# Patient Record
Sex: Male | Born: 1980
Health system: Southern US, Community
[De-identification: ages and names within clinical notes are randomized; demographics above are authoritative.]

## PROBLEM LIST (undated history)

## (undated) DIAGNOSIS — Q666 Other congenital valgus deformities of feet: Secondary | ICD-10-CM

## (undated) DIAGNOSIS — Q66219 Congenital metatarsus primus varus, unspecified foot: Secondary | ICD-10-CM

## (undated) DIAGNOSIS — Z8669 Personal history of other diseases of the nervous system and sense organs: Secondary | ICD-10-CM

## (undated) HISTORY — PX: BUNIONECTOMY: SHX129

## (undated) HISTORY — DX: Personal history of other diseases of the nervous system and sense organs: Z86.69

---
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
## 2001-12-20 ENCOUNTER — Emergency Department (HOSPITAL_COMMUNITY): Admission: EM | Admit: 2001-12-20 | Discharge: 2001-12-20 | Payer: Self-pay | Admitting: Emergency Medicine

## 2001-12-20 ENCOUNTER — Encounter: Payer: Self-pay | Admitting: Emergency Medicine

## 2005-11-21 ENCOUNTER — Ambulatory Visit: Payer: Self-pay | Admitting: Internal Medicine

## 2005-11-23 ENCOUNTER — Ambulatory Visit: Payer: Self-pay | Admitting: Internal Medicine

## 2006-10-10 ENCOUNTER — Ambulatory Visit: Payer: Self-pay | Admitting: Internal Medicine

## 2006-10-10 LAB — CONVERTED CEMR LAB
ALT: 23 units/L (ref 0–53)
AST: 29 units/L (ref 0–37)
Albumin: 4.1 g/dL (ref 3.5–5.2)
Alkaline Phosphatase: 38 units/L — ABNORMAL LOW (ref 39–117)
BUN: 11 mg/dL (ref 6–23)
Basophils Absolute: 0 10*3/uL (ref 0.0–0.1)
Basophils Relative: 0.6 % (ref 0.0–1.0)
Bilirubin Urine: NEGATIVE
Bilirubin, Direct: 0.2 mg/dL (ref 0.0–0.3)
CO2: 34 meq/L — ABNORMAL HIGH (ref 19–32)
Calcium: 9.2 mg/dL (ref 8.4–10.5)
Chloride: 105 meq/L (ref 96–112)
Cholesterol: 117 mg/dL (ref 0–200)
Creatinine, Ser: 1 mg/dL (ref 0.4–1.5)
Eosinophils Absolute: 0.3 10*3/uL (ref 0.0–0.6)
Eosinophils Relative: 4.7 % (ref 0.0–5.0)
GFR calc Af Amer: 116 mL/min
GFR calc non Af Amer: 96 mL/min
Glucose, Bld: 87 mg/dL (ref 70–99)
HCT: 43.7 % (ref 39.0–52.0)
HDL: 42.5 mg/dL (ref >39.0)
Hemoglobin, Urine: NEGATIVE
Hemoglobin: 14.9 g/dL (ref 13.0–17.0)
Ketones, ur: NEGATIVE mg/dL
LDL Cholesterol: 66 mg/dL (ref 0–99)
Leukocytes, UA: NEGATIVE
Lymphocytes Relative: 28.8 % (ref 12.0–46.0)
MCHC: 34.1 g/dL (ref 30.0–36.0)
MCV: 79.6 fL (ref 78.0–100.0)
Monocytes Absolute: 0.6 10*3/uL (ref 0.2–0.7)
Monocytes Relative: 10.2 % (ref 3.0–11.0)
Neutro Abs: 2.9 10*3/uL (ref 1.4–7.7)
Neutrophils Relative %: 55.7 % (ref 43.0–77.0)
Nitrite: NEGATIVE
Platelets: 308 10*3/uL (ref 150–400)
Potassium: 3.5 meq/L (ref 3.5–5.1)
RBC: 5.48 M/uL (ref 4.22–5.81)
RDW: 12.3 % (ref 11.5–14.6)
Sodium: 144 meq/L (ref 135–145)
Specific Gravity, Urine: >=1.03 (ref 1.000–1.03)
TSH: 0.55 microintl units/mL (ref 0.35–5.50)
Total Bilirubin: 1.2 mg/dL (ref 0.3–1.2)
Total CHOL/HDL Ratio: 2.8
Total Protein: 7 g/dL (ref 6.0–8.3)
Triglycerides: 44 mg/dL (ref 0–149)
Urine Glucose: NEGATIVE mg/dL
Urobilinogen, UA: 1 (ref 0.0–1.0)
VLDL: 9 mg/dL (ref 0–40)
WBC: 5.4 10*3/uL (ref 4.5–10.5)
pH: 6 (ref 5.0–8.0)

## 2006-10-12 ENCOUNTER — Ambulatory Visit: Payer: Self-pay | Admitting: Internal Medicine

## 2007-01-25 ENCOUNTER — Encounter: Payer: Self-pay | Admitting: Internal Medicine

## 2007-09-10 ENCOUNTER — Ambulatory Visit: Payer: Self-pay | Admitting: Internal Medicine

## 2007-09-10 DIAGNOSIS — R040 Epistaxis: Secondary | ICD-10-CM | POA: Insufficient documentation

## 2011-04-05 ENCOUNTER — Ambulatory Visit (INDEPENDENT_AMBULATORY_CARE_PROVIDER_SITE_OTHER): Payer: BC Managed Care – PPO | Admitting: Family

## 2011-04-05 ENCOUNTER — Encounter: Payer: Self-pay | Admitting: Family

## 2011-04-05 ENCOUNTER — Ambulatory Visit: Payer: Self-pay | Admitting: Family

## 2011-04-05 DIAGNOSIS — J029 Acute pharyngitis, unspecified: Secondary | ICD-10-CM

## 2011-04-05 MED ORDER — AMOXICILLIN-POT CLAVULANATE 875-125 MG PO TABS: 1.0000 | ORAL_TABLET | Freq: Two times a day (BID) | ORAL | Status: AC

## 2011-04-05 NOTE — Progress Notes (Signed)
Subjective:    Patient ID: Daniel Mcclure, male    DOB: [birth year], 31 y.o.   MRN: [id number]  HPI  Daniel Mcclure is a 31 yr old who presents today to establish care.  He presents today with complaint of sore throat.  Sore throat started yesterday.  Daughter and wife have strep.  Denies fever.  + associated HA.  Symptoms are worsening.   He notes that he hurt his knee playing basketball 2 days ago.  He applied ice that night and notes that the knee pain is improving.   Reports that he is healthy overall.     Review of Systems  Constitutional: Negative for unexpected weight change.  HENT: Positive for congestion.   Eyes: Negative for visual disturbance.  Respiratory: Positive for cough.   Cardiovascular: Negative for leg swelling.  Gastrointestinal: Negative for nausea, vomiting and diarrhea.  Genitourinary: Negative for dysuria and hematuria.  Musculoskeletal:       Mild left knee pain since he played basketball the other day.  Skin: Negative for rash.  Neurological: Positive for headaches.  Hematological: Positive for adenopathy.  Psychiatric/Behavioral:       Denies depression/anxiety   History reviewed. No pertinent past medical history.  History   Social History  . Marital Status: Married    Spouse Name: N/A    Number of Children: 1  . Years of Education: N/A   Occupational History  .     Social History Main Topics  . Smoking status: Never Smoker   . Smokeless tobacco: Never Used  . Alcohol Use: Yes     occasional  . Drug Use: Not on file  . Sexually Active: Not on file   Other Topics Concern  . Not on file   Social History Narrative   Caffeine use:  1 soda every other dayRegular exercise:  4-5 x weeklyMarried, 1 daughter born in 29Works at [employer], television/film set. Student at [employer]    Past Surgical History  Procedure Date  . Knee surgery 1992    left knee surgery due to ?sports injury?    Family History  Problem Relation Age  of Onset  . Hypertension Father   . Stroke Paternal Grandfather   . Cancer Neg Hx     No Known Allergies  No current outpatient prescriptions on file prior to visit.    BP 120/68  Pulse 74  Temp(Src) 98.6 F (37 C) (Oral)  Resp 16  Ht 5' 10.5" (1.791 m)  Wt 164 lb (74.39 kg)  BMI 23.20 kg/m2  SpO2 98%       Objective:   Physical Exam  Constitutional: He is oriented to person, place, and time. He appears well-developed and well-nourished. No distress.  HENT:  Head: Normocephalic and atraumatic.  Right Ear: Tympanic membrane and ear canal normal.  Left Ear: Tympanic membrane and ear canal normal.  Mouth/Throat: Posterior oropharyngeal erythema present. No oropharyngeal exudate or posterior oropharyngeal edema.  Cardiovascular: Normal rate and regular rhythm.   No murmur heard. Pulmonary/Chest: Effort normal and breath sounds normal. No respiratory distress. He has no wheezes. He has no rales. He exhibits no tenderness.  Lymphadenopathy:    He has no cervical adenopathy.  Neurological: He is alert and oriented to person, place, and time.  Skin: Skin is warm and dry. No rash noted. No erythema.  Psychiatric: He has a normal mood and affect. His behavior is normal. Judgment and thought content normal.  Assessment & Plan:

## 2011-04-05 NOTE — Patient Instructions (Signed)
Please call if symptoms worsen or if you are not feeling better in 2-3 days.  

## 2011-04-08 DIAGNOSIS — J029 Acute pharyngitis, unspecified: Secondary | ICD-10-CM | POA: Insufficient documentation

## 2011-04-08 NOTE — Assessment & Plan Note (Signed)
Rapid strep is negative, but wife and daughter have tested positive. Will plan to treat empirically with Augmentin.

## 2012-08-02 ENCOUNTER — Emergency Department (HOSPITAL_BASED_OUTPATIENT_CLINIC_OR_DEPARTMENT_OTHER)
Admission: EM | Admit: 2012-08-02 | Discharge: 2012-08-02 | Disposition: A | Discharge status: Home / Self Care | Payer: BC Managed Care – PPO | Attending: Emergency Medicine | Admitting: Emergency Medicine

## 2012-08-02 ENCOUNTER — Encounter (HOSPITAL_BASED_OUTPATIENT_CLINIC_OR_DEPARTMENT_OTHER): Payer: Self-pay | Admitting: *Deleted

## 2012-08-02 DIAGNOSIS — H00016 Hordeolum externum left eye, unspecified eyelid: Secondary | ICD-10-CM

## 2012-08-02 DIAGNOSIS — H00019 Hordeolum externum unspecified eye, unspecified eyelid: Secondary | ICD-10-CM | POA: Insufficient documentation

## 2012-08-02 MED ORDER — ERYTHROMYCIN 5 MG/GM OP OINT: TOPICAL_OINTMENT | OPHTHALMIC | Status: DC

## 2012-08-02 NOTE — ED Provider Notes (Signed)
History     CSN: [id number]  Arrival date & time 08/02/12  0849   First MD Initiated Contact with Patient 08/02/12 [phone]      Chief Complaint  Patient presents with  . Stye    Patient is a 32 y.o. male presenting with eye pain. The history is provided by the patient.  Eye Pain This is a new problem. The current episode started more than 2 days ago. The problem occurs daily. The problem has been gradually worsening. Pertinent negatives include no headaches. Nothing aggravates the symptoms. Nothing relieves the symptoms. He has tried nothing for the symptoms.    PMH - none  Past Surgical History  Procedure Laterality Date  . Knee surgery  1992    left knee surgery due to ?sports injury?    Family History  Problem Relation Age of Onset  . Hypertension Father   . Stroke Paternal Grandfather   . Cancer Neg Hx     History  Substance Use Topics  . Smoking status: Never Smoker   . Smokeless tobacco: Never Used  . Alcohol Use: Yes     Comment: occasional      Review of Systems  Constitutional: Negative for fever.  Eyes: Positive for pain.  Neurological: Negative for headaches.    Allergies  Review of patient's allergies indicates no known allergies.  Home Medications   Current Outpatient Rx  Name  Route  Sig  Dispense  Refill  . erythromycin ophthalmic ointment      Place a 1/2 inch ribbon of ointment into the lower eyelid BID for one week   1 g   0     BP 120/62  Pulse 62  Temp(Src) 98.4 F (36.9 C) (Oral)  SpO2 100%  Physical Exam CONSTITUTIONAL: Well developed/well nourished HEAD: Normocephalic/atraumatic EYES: EOMI/PERRL Hordeolum on left lower eyedlid margin No foreign body to OS.  No signs of trauma.  No conjunctival erythema ENMT: Mucous membranes moist NECK: supple no meningeal signs CV: S1/S2 noted, no murmurs/rubs/gallops noted LUNGS: Lungs are clear to auscultation bilaterally, no apparent distress ABDOMEN: soft NEURO: Pt is  awake/alert, moves all extremitiesx4 EXTREMITIES: full ROM SKIN: warm, color normal PSYCH: no abnormalities of mood noted  ED Course  Procedures   1. Hordeolum externum of left eye       MDM  Nursing notes including past medical history and social history reviewed and considered in documentation   Pt does not wear contacts Denies eye trauma Stable for d/c        Joya Gaskins, MD 08/02/12 [phone]

## 2012-08-02 NOTE — ED Notes (Signed)
Left eye painful swollen area lower rim of eye pt states has had stye before feels like same. No change in vision. No matting in mornings.

## 2014-04-04 ENCOUNTER — Ambulatory Visit (INDEPENDENT_AMBULATORY_CARE_PROVIDER_SITE_OTHER): Payer: BLUE CROSS/BLUE SHIELD | Admitting: Physician Assistant

## 2014-04-04 ENCOUNTER — Encounter: Payer: Self-pay | Admitting: Physician Assistant

## 2014-04-04 VITALS — BP 123/64 | HR 70 | Temp 100.2°F | Resp 16 | Ht 70.5 in | Wt 175.1 lb

## 2014-04-04 DIAGNOSIS — B9689 Other specified bacterial agents as the cause of diseases classified elsewhere: Secondary | ICD-10-CM | POA: Insufficient documentation

## 2014-04-04 DIAGNOSIS — R509 Fever, unspecified: Secondary | ICD-10-CM

## 2014-04-04 DIAGNOSIS — J019 Acute sinusitis, unspecified: Secondary | ICD-10-CM

## 2014-04-04 LAB — POCT INFLUENZA A/B
Influenza A, POC: NEGATIVE
Influenza B, POC: NEGATIVE

## 2014-04-04 MED ORDER — HYDROCOD POLST-CHLORPHEN POLST 10-8 MG/5ML PO LQCR: 5.0000 mL | Freq: Two times a day (BID) | ORAL | Status: DC | PRN

## 2014-04-04 MED ORDER — AMOXICILLIN-POT CLAVULANATE 875-125 MG PO TABS: 1.0000 | ORAL_TABLET | Freq: Two times a day (BID) | ORAL | Status: DC

## 2014-04-04 MED ORDER — FLUTICASONE PROPIONATE 50 MCG/ACT NA SUSP: 2.0000 | Freq: Every day | NASAL | Status: DC

## 2014-04-04 NOTE — Progress Notes (Signed)
Pre visit review using our clinic review tool, if applicable. No additional management support is needed unless otherwise documented below in the visit note/SLS  

## 2014-04-04 NOTE — Progress Notes (Signed)
    Patient presents to clinic today c/o 4 days of progressively worsening PND, headaches, productive cough and fatigue.  Is now having ear pressure and pain.  Endorses has now developed a fever and some body aches, chills. Has taken Benadryl without relief of symptoms. Has not had flu shot this year.  No past medical history on file.  No current outpatient prescriptions on file prior to visit.   No current facility-administered medications on file prior to visit.    No Known Allergies  Family History  Problem Relation Age of Onset  . Hypertension Father   . Stroke Paternal Grandfather   . Cancer Neg Hx     History   Social History  . Marital Status: Married    Spouse Name: N/A  . Number of Children: 1  . Years of Education: N/A   Occupational History  .     Social History Main Topics  . Smoking status: Never Smoker   . Smokeless tobacco: Never Used  . Alcohol Use: Yes     Comment: occasional  . Drug Use: Not on file  . Sexual Activity: Not on file   Other Topics Concern  . None   Social History Narrative   Caffeine use:  1 soda every other day   Regular exercise:  4-5 x weekly   Married, 1 daughter born in 2010   Works at [employer] and [place] [employer] [place].    Student at [employer] - See HPI.  All other ROS are negative.  BP 123/64 mmHg  Pulse 70  Temp(Src) 100.2 F (37.9 C) (Oral)  Resp 16  Ht 5' 10.5" (1.791 m)  Wt 175 lb 2 oz (79.436 kg)  BMI 24.76 kg/m2  SpO2 100%  Physical Exam  Constitutional: He is oriented to person, place, and time and well-developed, well-nourished, and in no distress.  HENT:  Head: Normocephalic and atraumatic.  Right Ear: Tympanic membrane, external ear and ear canal normal.  Left Ear: Tympanic membrane, external ear and ear canal normal.  Nose: Mucosal edema and rhinorrhea present. Right sinus exhibits maxillary sinus tenderness. Left sinus exhibits maxillary sinus tenderness.    Mouth/Throat: Uvula is midline, oropharynx is clear and moist and mucous membranes are normal. No oropharyngeal exudate.  Eyes: Conjunctivae are normal. Pupils are equal, round, and reactive to light.  Neck: Neck supple.  Cardiovascular: Normal rate, regular rhythm, normal heart sounds and intact distal pulses.   Pulmonary/Chest: Effort normal and breath sounds normal. No respiratory distress. He has no wheezes. He has no rales. He exhibits no tenderness.  Lymphadenopathy:    He has no cervical adenopathy.  Neurological: He is alert and oriented to person, place, and time.  Skin: Skin is warm and dry. No rash noted.  Psychiatric: Affect normal.  Vitals reviewed.  Assessment/Plan: Acute bacterial sinusitis Flu swab negative. Rx Augmentin.  Increase fluids.  Rest.  Saline nasal spray.  Probiotic.  Mucinex as directed.  Humidifier in bedroom. Flonase daily.  Tussionex per orders..  Call or return to clinic if symptoms are not improving.

## 2014-04-04 NOTE — Addendum Note (Signed)
Addended by: Rockwell Germany on: 04/04/2014 05:15 PM   Modules accepted: Orders

## 2014-04-04 NOTE — Progress Notes (Signed)
Duplicate encounter.  Disregard.

## 2014-04-04 NOTE — Patient Instructions (Signed)
Please take antibiotic as directed.  Increase fluid intake.  Use Saline nasal spray.  Take a daily multivitamin. Use Flonase daily as directed for sinus inflammation and allergy symptoms.  Use Tussionex as directed for cough.  Place a humidifier in the bedroom.  Please call or return clinic if symptoms are not improving.  Sinusitis Sinusitis is redness, soreness, and swelling (inflammation) of the paranasal sinuses. Paranasal sinuses are air pockets within the bones of your face (beneath the eyes, the middle of the forehead, or above the eyes). In healthy paranasal sinuses, mucus is able to drain out, and air is able to circulate through them by way of your nose. However, when your paranasal sinuses are inflamed, mucus and air can become trapped. This can allow bacteria and other germs to grow and cause infection. Sinusitis can develop quickly and last only a short time (acute) or continue over a long period (chronic). Sinusitis that lasts for more than 12 weeks is considered chronic.  CAUSES  Causes of sinusitis include:  Allergies.  Structural abnormalities, such as displacement of the cartilage that separates your nostrils (deviated septum), which can decrease the air flow through your nose and sinuses and affect sinus drainage.  Functional abnormalities, such as when the small hairs (cilia) that line your sinuses and help remove mucus do not work properly or are not present. SYMPTOMS  Symptoms of acute and chronic sinusitis are the same. The primary symptoms are pain and pressure around the affected sinuses. Other symptoms include:  Upper toothache.  Earache.  Headache.  Bad breath.  Decreased sense of smell and taste.  A cough, which worsens when you are lying flat.  Fatigue.  Fever.  Thick drainage from your nose, which often is green and may contain pus (purulent).  Swelling and warmth over the affected sinuses. DIAGNOSIS  Your caregiver will perform a physical exam. During  the exam, your caregiver may:  Look in your nose for signs of abnormal growths in your nostrils (nasal polyps).  Tap over the affected sinus to check for signs of infection.  View the inside of your sinuses (endoscopy) with a special imaging device with a light attached (endoscope), which is inserted into your sinuses. If your caregiver suspects that you have chronic sinusitis, one or more of the following tests may be recommended:  Allergy tests.  Nasal culture A sample of mucus is taken from your nose and sent to a lab and screened for bacteria.  Nasal cytology A sample of mucus is taken from your nose and examined by your caregiver to determine if your sinusitis is related to an allergy. TREATMENT  Most cases of acute sinusitis are related to a viral infection and will resolve on their own within 10 days. Sometimes medicines are prescribed to help relieve symptoms (pain medicine, decongestants, nasal steroid sprays, or saline sprays).  However, for sinusitis related to a bacterial infection, your caregiver will prescribe antibiotic medicines. These are medicines that will help kill the bacteria causing the infection.  Rarely, sinusitis is caused by a fungal infection. In theses cases, your caregiver will prescribe antifungal medicine. For some cases of chronic sinusitis, surgery is needed. Generally, these are cases in which sinusitis recurs more than 3 times per year, despite other treatments. HOME CARE INSTRUCTIONS   Drink plenty of water. Water helps thin the mucus so your sinuses can drain more easily.  Use a humidifier.  Inhale steam 3 to 4 times a day (for example, sit in the bathroom with  the shower running).  Apply a warm, moist washcloth to your face 3 to 4 times a day, or as directed by your caregiver.  Use saline nasal sprays to help moisten and clean your sinuses.  Take over-the-counter or prescription medicines for pain, discomfort, or fever only as directed by your  caregiver. SEEK IMMEDIATE MEDICAL CARE IF:  You have increasing pain or severe headaches.  You have nausea, vomiting, or drowsiness.  You have swelling around your face.  You have vision problems.  You have a stiff neck.  You have difficulty breathing. MAKE SURE YOU:   Understand these instructions.  Will watch your condition.  Will get help right away if you are not doing well or get worse. Document Released: 02/07/2005 Document Revised: 05/02/2011 Document Reviewed: 02/22/2011 [facility] Patient Information 2014 [place], [place].

## 2014-04-04 NOTE — Assessment & Plan Note (Signed)
Flu swab negative. Rx Augmentin.  Increase fluids.  Rest.  Saline nasal spray.  Probiotic.  Mucinex as directed.  Humidifier in bedroom. Flonase daily.  Tussionex per orders..  Call or return to clinic if symptoms are not improving.

## 2014-10-01 ENCOUNTER — Other Ambulatory Visit: Payer: Self-pay | Admitting: Orthopedic Surgery

## 2014-10-13 ENCOUNTER — Encounter: Payer: Self-pay | Admitting: *Deleted

## 2014-10-13 ENCOUNTER — Telehealth: Payer: Self-pay | Admitting: *Deleted

## 2014-10-13 NOTE — Telephone Encounter (Signed)
Pre-Visit Call completed with patient and chart updated.   Pre-Visit Info documented in Specialty Comments under [employer].    

## 2014-10-13 NOTE — Addendum Note (Signed)
Addended by: Leticia Penna A on: 10/13/2014 11:45 AM   Modules accepted: Orders, Medications

## 2014-10-13 NOTE — Telephone Encounter (Signed)
Unable to reach patient at time of Pre-Visit Call.  Left message for patient to return call when available.    

## 2014-10-14 ENCOUNTER — Encounter: Payer: Self-pay | Admitting: Physician Assistant

## 2014-10-14 ENCOUNTER — Ambulatory Visit (INDEPENDENT_AMBULATORY_CARE_PROVIDER_SITE_OTHER): Payer: BLUE CROSS/BLUE SHIELD | Admitting: Physician Assistant

## 2014-10-14 VITALS — BP 108/80 | HR 59 | Temp 98.5°F | Ht 70.0 in | Wt 177.0 lb

## 2014-10-14 DIAGNOSIS — Z0000 Encounter for general adult medical examination without abnormal findings: Secondary | ICD-10-CM

## 2014-10-14 LAB — URINALYSIS, ROUTINE W REFLEX MICROSCOPIC
Bilirubin Urine: NEGATIVE
HGB URINE DIPSTICK: NEGATIVE
Ketones, ur: NEGATIVE
LEUKOCYTES UA: NEGATIVE
NITRITE: NEGATIVE
RBC / HPF: NONE SEEN (ref 0–?)
Specific Gravity, Urine: >=1.03 — AB (ref 1.000–1.030)
Total Protein, Urine: NEGATIVE
Urine Glucose: NEGATIVE
Urobilinogen, UA: 0.2 (ref 0.0–1.0)
WBC, UA: NONE SEEN (ref 0–?)
pH: 6 (ref 5.0–8.0)

## 2014-10-14 NOTE — Assessment & Plan Note (Signed)
Depression screen negative. Health Maintenance reviewed -- Immunizations up-to-date. HIV screen in 2010. Preventive schedule discussed and handout given in AVS. Will obtain fasting labs today.

## 2014-10-14 NOTE — Progress Notes (Signed)
Patient presents to clinic today for annual exam.  Patient is fasting for labs.  Endorses well-balanced diet. Endorses daily exercise. Body mass index is 25.4 kg/(m^2).  Health Maintenance: Dental -- up-to-date Vision -- up-to-date Immunizations -- up-to-date HIV Screen -- average risk. Has had one-time screening 2010 before daughter was born.  Past Medical History  Diagnosis Date  . Bunion     Past Surgical History  Procedure Laterality Date  . Knee surgery  1992    left knee surgery due to ?sports injury?  Marland Kitchen Foot surgery Left     No current outpatient prescriptions on file prior to visit.   No current facility-administered medications on file prior to visit.    No Known Allergies  Family History  Problem Relation Age of Onset  . Hypertension Father   . Stroke Paternal Grandfather   . Cancer Neg Hx     Social History   Social History  . Marital Status: Married    Spouse Name: N/A  . Number of Children: 1  . Years of Education: N/A   Occupational History  .     Social History Main Topics  . Smoking status: Never Smoker   . Smokeless tobacco: Never Used  . Alcohol Use: Yes     Comment: occasional  . Drug Use: Not on file  . Sexual Activity: Not on file   Other Topics Concern  . Not on file   Social History Narrative   Caffeine use:  1 soda every other day   Regular exercise:  4-5 x weekly   Married, 1 daughter born in 2010   Works at [employer] and [place].    Student at [employer]            Review of Systems  Constitutional: Negative for fever and weight loss.  HENT: Negative for ear discharge, ear pain, hearing loss and tinnitus.   Eyes: Negative for blurred vision, double vision, photophobia and pain.  Respiratory: Negative for cough and shortness of breath.   Cardiovascular: Negative for chest pain and palpitations.  Gastrointestinal: Negative for heartburn, nausea, vomiting, abdominal pain, diarrhea, constipation,  blood in stool and melena.  Genitourinary: Negative for dysuria, urgency, frequency, hematuria and flank pain.  Musculoskeletal: Negative for falls.  Neurological: Negative for dizziness, loss of consciousness and headaches.  Endo/Heme/Allergies: Negative for environmental allergies.  Psychiatric/Behavioral: Negative for depression, suicidal ideas, hallucinations and substance abuse. The patient is not nervous/anxious and does not have insomnia.     BP 108/80 mmHg  Pulse 59  Temp(Src) 98.5 F (36.9 C) (Oral)  Ht 5' 10"  (1.778 m)  Wt 177 lb (80.287 kg)  BMI 25.40 kg/m2  SpO2 96%  Physical Exam  Constitutional: He is oriented to person, place, and time and well-developed, well-nourished, and in no distress.  HENT:  Head: Normocephalic and atraumatic.  Right Ear: External ear normal.  Left Ear: External ear normal.  Nose: Nose normal.  Mouth/Throat: Oropharynx is clear and moist. No oropharyngeal exudate.  Eyes: Conjunctivae and EOM are normal. Pupils are equal, round, and reactive to light.  Neck: Neck supple. No thyromegaly present.  Cardiovascular: Normal rate, regular rhythm, normal heart sounds and intact distal pulses.   Pulmonary/Chest: Effort normal and breath sounds normal. No respiratory distress. He has no wheezes. He has no rales. He exhibits no tenderness.  Abdominal: Soft. Bowel sounds are normal. He exhibits no distension and no mass. There is no tenderness. There is no rebound and  no guarding.  Genitourinary: Testes/scrotum normal.  Defers.  Lymphadenopathy:    He has no cervical adenopathy.  Neurological: He is alert and oriented to person, place, and time.  Skin: Skin is warm and dry. No rash noted.  Psychiatric: Affect normal.  Vitals reviewed.   No results found for this or any previous visit (from the past 2160 hour(s)).  Assessment/Plan: Visit for preventive health examination Depression screen negative. Health Maintenance reviewed -- Immunizations  up-to-date. HIV screen in 2010. Preventive schedule discussed and handout given in AVS. Will obtain fasting labs today.

## 2014-10-14 NOTE — Progress Notes (Signed)
Pre visit review using our clinic review tool, if applicable. No additional management support is needed unless otherwise documented below in the visit note. 

## 2014-10-14 NOTE — Patient Instructions (Signed)
Please go to the lab for blood work.  I will call you with your results. If your blood work is normal we will follow-up yearly for physicals.  If anything is abnormal we will treat you accordingly and get you in for a follow-up.  Preventive Care for Adults A healthy lifestyle and preventive care can promote health and wellness. Preventive health guidelines for men include the following key practices:  A routine yearly physical is a good way to check with your health care provider about your health and preventative screening. It is a chance to share any concerns and updates on your health and to receive a thorough exam.  Visit your dentist for a routine exam and preventative care every 6 months. Brush your teeth twice a day and floss once a day. Good oral hygiene prevents tooth decay and gum disease.  The frequency of eye exams is based on your age, health, family medical history, use of contact lenses, and other factors. Follow your health care provider's recommendations for frequency of eye exams.  Eat a healthy diet. Foods such as vegetables, fruits, whole grains, low-fat dairy products, and lean protein foods contain the nutrients you need without too many calories. Decrease your intake of foods high in solid fats, added sugars, and salt. Eat the right amount of calories for you.Get information about a proper diet from your health care provider, if necessary.  Regular physical exercise is one of the most important things you can do for your health. Most adults should get at least 150 minutes of moderate-intensity exercise (any activity that increases your heart rate and causes you to sweat) each week. In addition, most adults need muscle-strengthening exercises on 2 or more days a week.  Maintain a healthy weight. The body mass index (BMI) is a screening tool to identify possible weight problems. It provides an estimate of body fat based on height and weight. Your health care provider can find  your BMI and can help you achieve or maintain a healthy weight.For adults 20 years and older:  A BMI below 18.5 is considered underweight.  A BMI of 18.5 to 24.9 is normal.  A BMI of 25 to 29.9 is considered overweight.  A BMI of 30 and above is considered obese.  Maintain normal blood lipids and cholesterol levels by exercising and minimizing your intake of saturated fat. Eat a balanced diet with plenty of fruit and vegetables. Blood tests for lipids and cholesterol should begin at age 67 and be repeated every 5 years. If your lipid or cholesterol levels are high, you are over 50, or you are at high risk for heart disease, you may need your cholesterol levels checked more frequently.Ongoing high lipid and cholesterol levels should be treated with medicines if diet and exercise are not working.  If you smoke, find out from your health care provider how to quit. If you do not use tobacco, do not start.  Lung cancer screening is recommended for adults aged 27-80 years who are at high risk for developing lung cancer because of a history of smoking. A yearly low-dose CT scan of the lungs is recommended for people who have at least a 30-pack-year history of smoking and are a current smoker or have quit within the past 15 years. A pack year of smoking is smoking an average of 1 pack of cigarettes a day for 1 year (for example: 1 pack a day for 30 years or 2 packs a day for 15 years). Yearly screening  should continue until the smoker has stopped smoking for at least 15 years. Yearly screening should be stopped for people who develop a health problem that would prevent them from having lung cancer treatment.  If you choose to drink alcohol, do not have more than 2 drinks per day. One drink is considered to be 12 ounces (355 mL) of beer, 5 ounces (148 mL) of wine, or 1.5 ounces (44 mL) of liquor.  Avoid use of street drugs. Do not share needles with anyone. Ask for help if you need support or instructions  about stopping the use of drugs.  High blood pressure causes heart disease and increases the risk of stroke. Your blood pressure should be checked at least every 1-2 years. Ongoing high blood pressure should be treated with medicines, if weight loss and exercise are not effective.  If you are 16-108 years old, ask your health care provider if you should take aspirin to prevent heart disease.  Diabetes screening involves taking a blood sample to check your fasting blood sugar level. This should be done once every 3 years, after age 4, if you are within normal weight and without risk factors for diabetes. Testing should be considered at a younger age or be carried out more frequently if you are overweight and have at least 1 risk factor for diabetes.  Colorectal cancer can be detected and often prevented. Most routine colorectal cancer screening begins at the age of 12 and continues through age 28. However, your health care provider may recommend screening at an earlier age if you have risk factors for colon cancer. On a yearly basis, your health care provider may provide home test kits to check for hidden blood in the stool. Use of a small camera at the end of a tube to directly examine the colon (sigmoidoscopy or colonoscopy) can detect the earliest forms of colorectal cancer. Talk to your health care provider about this at age 51, when routine screening begins. Direct exam of the colon should be repeated every 5-10 years through age 21, unless early forms of precancerous polyps or small growths are found.  People who are at an increased risk for hepatitis B should be screened for this virus. You are considered at high risk for hepatitis B if:  You were born in a country where hepatitis B occurs often. Talk with your health care provider about which countries are considered high risk.  Your parents were born in a high-risk country and you have not received a shot to protect against hepatitis B  (hepatitis B vaccine).  You have HIV or AIDS.  You use needles to inject street drugs.  You live with, or have sex with, someone who has hepatitis B.  You are a man who has sex with other men (MSM).  You get hemodialysis treatment.  You take certain medicines for conditions such as cancer, organ transplantation, and autoimmune conditions.  Hepatitis C blood testing is recommended for all people born from [birth year] through 1965 and any individual with known risks for hepatitis C.  Practice safe sex. Use condoms and avoid high-risk sexual practices to reduce the spread of sexually transmitted infections (STIs). STIs include gonorrhea, chlamydia, syphilis, trichomonas, herpes, HPV, and human immunodeficiency virus (HIV). Herpes, HIV, and HPV are viral illnesses that have no cure. They can result in disability, cancer, and death.  If you are at risk of being infected with HIV, it is recommended that you take a prescription medicine daily to prevent HIV infection.  This is called preexposure prophylaxis (PrEP). You are considered at risk if:  You are a man who has sex with other men (MSM) and have other risk factors.  You are a heterosexual man, are sexually active, and are at increased risk for HIV infection.  You take drugs by injection.  You are sexually active with a partner who has HIV.  Talk with your health care provider about whether you are at high risk of being infected with HIV. If you choose to begin PrEP, you should first be tested for HIV. You should then be tested every 3 months for as long as you are taking PrEP.  A one-time screening for abdominal aortic aneurysm (AAA) and surgical repair of large AAAs by ultrasound are recommended for men ages 36 to 43 years who are current or former smokers.  Healthy men should no longer receive prostate-specific antigen (PSA) blood tests as part of routine cancer screening. Talk with your health care provider about prostate cancer  screening.  Testicular cancer screening is not recommended for adult males who have no symptoms. Screening includes self-exam, a health care provider exam, and other screening tests. Consult with your health care provider about any symptoms you have or any concerns you have about testicular cancer.  Use sunscreen. Apply sunscreen liberally and repeatedly throughout the day. You should seek shade when your shadow is shorter than you. Protect yourself by wearing long sleeves, pants, a wide-brimmed hat, and sunglasses year round, whenever you are outdoors.  Once a month, do a whole-body skin exam, using a mirror to look at the skin on your back. Tell your health care provider about new moles, moles that have irregular borders, moles that are larger than a pencil eraser, or moles that have changed in shape or color.  Stay current with required vaccines (immunizations).  Influenza vaccine. All adults should be immunized every year.  Tetanus, diphtheria, and acellular pertussis (Td, Tdap) vaccine. An adult who has not previously received Tdap or who does not know his vaccine status should receive 1 dose of Tdap. This initial dose should be followed by tetanus and diphtheria toxoids (Td) booster doses every 10 years. Adults with an unknown or incomplete history of completing a 3-dose immunization series with Td-containing vaccines should begin or complete a primary immunization series including a Tdap dose. Adults should receive a Td booster every 10 years.  Varicella vaccine. An adult without evidence of immunity to varicella should receive 2 doses or a second dose if he has previously received 1 dose.  Human papillomavirus (HPV) vaccine. Males aged 24-21 years who have not received the vaccine previously should receive the 3-dose series. Males aged 22-26 years may be immunized. Immunization is recommended through the age of 27 years for any male who has sex with males and did not get any or all doses  earlier. Immunization is recommended for any person with an immunocompromised condition through the age of 70 years if he did not get any or all doses earlier. During the 3-dose series, the second dose should be obtained 4-8 weeks after the first dose. The third dose should be obtained 24 weeks after the first dose and 16 weeks after the second dose.  Zoster vaccine. One dose is recommended for adults aged 78 years or older unless certain conditions are present.  Measles, mumps, and rubella (MMR) vaccine. Adults born before [birth year] generally are considered immune to measles and mumps. Adults born in [birth year] or later should have 1 or more  doses of MMR vaccine unless there is a contraindication to the vaccine or there is laboratory evidence of immunity to each of the three diseases. A routine second dose of MMR vaccine should be obtained at least 28 days after the first dose for students attending postsecondary schools, health care workers, or international travelers. People who received inactivated measles vaccine or an unknown type of measles vaccine during 1963-1967 should receive 2 doses of MMR vaccine. People who received inactivated mumps vaccine or an unknown type of mumps vaccine before 1979 and are at high risk for mumps infection should consider immunization with 2 doses of MMR vaccine. Unvaccinated health care workers born before [birth year] who lack laboratory evidence of measles, mumps, or rubella immunity or laboratory confirmation of disease should consider measles and mumps immunization with 2 doses of MMR vaccine or rubella immunization with 1 dose of MMR vaccine.  Pneumococcal 13-valent conjugate (PCV13) vaccine. When indicated, a person who is uncertain of his immunization history and has no record of immunization should receive the PCV13 vaccine. An adult aged 64 years or older who has certain medical conditions and has not been previously immunized should receive 1 dose of PCV13 vaccine. This PCV13  should be followed with a dose of pneumococcal polysaccharide (PPSV23) vaccine. The PPSV23 vaccine dose should be obtained at least 8 weeks after the dose of PCV13 vaccine. An adult aged 38 years or older who has certain medical conditions and previously received 1 or more doses of PPSV23 vaccine should receive 1 dose of PCV13. The PCV13 vaccine dose should be obtained 1 or more years after the last PPSV23 vaccine dose.  Pneumococcal polysaccharide (PPSV23) vaccine. When PCV13 is also indicated, PCV13 should be obtained first. All adults aged 10 years and older should be immunized. An adult younger than age 16 years who has certain medical conditions should be immunized. Any person who resides in a nursing home or long-term care facility should be immunized. An adult smoker should be immunized. People with an immunocompromised condition and certain other conditions should receive both PCV13 and PPSV23 vaccines. People with human immunodeficiency virus (HIV) infection should be immunized as soon as possible after diagnosis. Immunization during chemotherapy or radiation therapy should be avoided. Routine use of PPSV23 vaccine is not recommended for American Indians, [place] Natives, or people younger than 65 years unless there are medical conditions that require PPSV23 vaccine. When indicated, people who have unknown immunization and have no record of immunization should receive PPSV23 vaccine. One-time revaccination 5 years after the first dose of PPSV23 is recommended for people aged 19-64 years who have chronic kidney failure, nephrotic syndrome, asplenia, or immunocompromised conditions. People who received 1-2 doses of PPSV23 before age 64 years should receive another dose of PPSV23 vaccine at age 36 years or later if at least 5 years have passed since the previous dose. Doses of PPSV23 are not needed for people immunized with PPSV23 at or after age 24 years.  Meningococcal vaccine. Adults with asplenia or  persistent complement component deficiencies should receive 2 doses of quadrivalent meningococcal conjugate (MenACWY-D) vaccine. The doses should be obtained at least 2 months apart. Microbiologists working with certain meningococcal bacteria, [employer] recruits, people at risk during an outbreak, and people who travel to or live in countries with a high rate of meningitis should be immunized. A first-year college student up through age 110 years who is living in a residence hall should receive a dose if he did not receive a dose on or  after his 16th birthday. Adults who have certain high-risk conditions should receive one or more doses of vaccine.  Hepatitis A vaccine. Adults who wish to be protected from this disease, have certain high-risk conditions, work with hepatitis A-infected animals, work in hepatitis A research labs, or travel to or work in countries with a high rate of hepatitis A should be immunized. Adults who were previously unvaccinated and who anticipate close contact with an international adoptee during the first 60 days after arrival in the [place] from a country with a high rate of hepatitis A should be immunized.  Hepatitis B vaccine. Adults should be immunized if they wish to be protected from this disease, have certain high-risk conditions, may be exposed to blood or other infectious body fluids, are household contacts or sex partners of hepatitis B positive people, are clients or workers in certain care facilities, or travel to or work in countries with a high rate of hepatitis B.  Haemophilus influenzae type b (Hib) vaccine. A previously unvaccinated person with asplenia or sickle cell disease or having a scheduled splenectomy should receive 1 dose of Hib vaccine. Regardless of previous immunization, a recipient of a hematopoietic stem cell transplant should receive a 3-dose series 6-12 months after his successful transplant. Hib vaccine is not recommended for adults with HIV  infection. Preventive Service / Frequency Ages 52 to 60  Blood pressure check.** / Every 1 to 2 years.  Lipid and cholesterol check.** / Every 5 years beginning at age 45.  Hepatitis C blood test.** / For any individual with known risks for hepatitis C.  Skin self-exam. / Monthly.  Influenza vaccine. / Every year.  Tetanus, diphtheria, and acellular pertussis (Tdap, Td) vaccine.** / Consult your health care provider. 1 dose of Td every 10 years.  Varicella vaccine.** / Consult your health care provider.  HPV vaccine. / 3 doses over 6 months, if 91 or younger.  Measles, mumps, rubella (MMR) vaccine.** / You need at least 1 dose of MMR if you were born in 1957 or later. You may also need a second dose.  Pneumococcal 13-valent conjugate (PCV13) vaccine.** / Consult your health care provider.  Pneumococcal polysaccharide (PPSV23) vaccine.** / 1 to 2 doses if you smoke cigarettes or if you have certain conditions.  Meningococcal vaccine.** / 1 dose if you are age 57 to 32 years and a [employer] researcher living in a residence hall, or have one of several medical conditions. You may also need additional booster doses.  Hepatitis A vaccine.** / Consult your health care provider.  Hepatitis B vaccine.** / Consult your health care provider.  Haemophilus influenzae type b (Hib) vaccine.** / Consult your health care provider. Ages 22 to 57  Blood pressure check.** / Every 1 to 2 years.  Lipid and cholesterol check.** / Every 5 years beginning at age 87.  Lung cancer screening. / Every year if you are aged 81-80 years and have a 30-pack-year history of smoking and currently smoke or have quit within the past 15 years. Yearly screening is stopped once you have quit smoking for at least 15 years or develop a health problem that would prevent you from having lung cancer treatment.  Fecal occult blood test (FOBT) of stool. / Every year beginning at age 76 and continuing until age 58.  You may not have to do this test if you get a colonoscopy every 10 years.  Flexible sigmoidoscopy** or colonoscopy.** / Every 5 years for a flexible sigmoidoscopy or every  10 years for a colonoscopy beginning at age 19 and continuing until age 47.  Hepatitis C blood test.** / For all people born from [birth year] through 1965 and any individual with known risks for hepatitis C.  Skin self-exam. / Monthly.  Influenza vaccine. / Every year.  Tetanus, diphtheria, and acellular pertussis (Tdap/Td) vaccine.** / Consult your health care provider. 1 dose of Td every 10 years.  Varicella vaccine.** / Consult your health care provider.  Zoster vaccine.** / 1 dose for adults aged 11 years or older.  Measles, mumps, rubella (MMR) vaccine.** / You need at least 1 dose of MMR if you were born in 1957 or later. You may also need a second dose.  Pneumococcal 13-valent conjugate (PCV13) vaccine.** / Consult your health care provider.  Pneumococcal polysaccharide (PPSV23) vaccine.** / 1 to 2 doses if you smoke cigarettes or if you have certain conditions.  Meningococcal vaccine.** / Consult your health care provider.  Hepatitis A vaccine.** / Consult your health care provider.  Hepatitis B vaccine.** / Consult your health care provider.  Haemophilus influenzae type b (Hib) vaccine.** / Consult your health care provider. Ages 70 and over  Blood pressure check.** / Every 1 to 2 years.  Lipid and cholesterol check.**/ Every 5 years beginning at age 41.  Lung cancer screening. / Every year if you are aged 90-80 years and have a 30-pack-year history of smoking and currently smoke or have quit within the past 15 years. Yearly screening is stopped once you have quit smoking for at least 15 years or develop a health problem that would prevent you from having lung cancer treatment.  Fecal occult blood test (FOBT) of stool. / Every year beginning at age 75 and continuing until age 39. You may not have to do this  test if you get a colonoscopy every 10 years.  Flexible sigmoidoscopy** or colonoscopy.** / Every 5 years for a flexible sigmoidoscopy or every 10 years for a colonoscopy beginning at age 48 and continuing until age 18.  Hepatitis C blood test.** / For all people born from [birth year] through 1965 and any individual with known risks for hepatitis C.  Abdominal aortic aneurysm (AAA) screening.** / A one-time screening for ages 64 to 26 years who are current or former smokers.  Skin self-exam. / Monthly.  Influenza vaccine. / Every year.  Tetanus, diphtheria, and acellular pertussis (Tdap/Td) vaccine.** / 1 dose of Td every 10 years.  Varicella vaccine.** / Consult your health care provider.  Zoster vaccine.** / 1 dose for adults aged 46 years or older.  Pneumococcal 13-valent conjugate (PCV13) vaccine.** / Consult your health care provider.  Pneumococcal polysaccharide (PPSV23) vaccine.** / 1 dose for all adults aged 15 years and older.  Meningococcal vaccine.** / Consult your health care provider.  Hepatitis A vaccine.** / Consult your health care provider.  Hepatitis B vaccine.** / Consult your health care provider.  Haemophilus influenzae type b (Hib) vaccine.** / Consult your health care provider. **Family history and personal history of risk and conditions may change your health care provider's recommendations. Document Released: 04/05/2001 Document Revised: 02/12/2013 Document Reviewed: 07/05/2010 [facility] Patient Information 2015 [place], [place]. This information is not intended to replace advice given to you by your health care provider. Make sure you discuss any questions you have with your health care provider.

## 2014-12-22 ENCOUNTER — Ambulatory Visit: Payer: BLUE CROSS/BLUE SHIELD | Admitting: Physician Assistant

## 2014-12-22 ENCOUNTER — Telehealth: Payer: Self-pay | Admitting: Physician Assistant

## 2014-12-23 DIAGNOSIS — Q66219 Congenital metatarsus primus varus, unspecified foot: Secondary | ICD-10-CM

## 2014-12-23 HISTORY — DX: Other congenital valgus deformities of feet: Q66.219

## 2014-12-26 ENCOUNTER — Encounter (HOSPITAL_BASED_OUTPATIENT_CLINIC_OR_DEPARTMENT_OTHER): Payer: Self-pay | Admitting: *Deleted

## 2014-12-26 NOTE — Telephone Encounter (Signed)
Pt was no show 12/22/14 3:00pm for follow up appt, pt lvm at 2:05 stating he will not be in to his appt, pt has not rescheduled, charge or no charge?

## 2014-12-26 NOTE — Telephone Encounter (Signed)
No charge this time.

## 2015-01-01 ENCOUNTER — Ambulatory Visit (HOSPITAL_BASED_OUTPATIENT_CLINIC_OR_DEPARTMENT_OTHER): Payer: BLUE CROSS/BLUE SHIELD | Admitting: Certified Registered"

## 2015-01-01 ENCOUNTER — Encounter (HOSPITAL_BASED_OUTPATIENT_CLINIC_OR_DEPARTMENT_OTHER): Payer: Self-pay | Admitting: Certified Registered"

## 2015-01-01 ENCOUNTER — Ambulatory Visit (HOSPITAL_BASED_OUTPATIENT_CLINIC_OR_DEPARTMENT_OTHER)
Admission: RE | Admit: 2015-01-01 | Discharge: 2015-01-01 | Disposition: A | Discharge status: Home / Self Care | Payer: BLUE CROSS/BLUE SHIELD | Source: Ambulatory Visit | Attending: Orthopedic Surgery | Admitting: Orthopedic Surgery

## 2015-01-01 ENCOUNTER — Encounter (HOSPITAL_BASED_OUTPATIENT_CLINIC_OR_DEPARTMENT_OTHER)
Admission: RE | Disposition: A | Discharge status: Home / Self Care | Payer: Self-pay | Source: Ambulatory Visit | Attending: Orthopedic Surgery

## 2015-01-01 DIAGNOSIS — M2011 Hallux valgus (acquired), right foot: Secondary | ICD-10-CM | POA: Diagnosis not present

## 2015-01-01 DIAGNOSIS — Q6621 Congenital metatarsus primus varus: Secondary | ICD-10-CM | POA: Diagnosis not present

## 2015-01-01 HISTORY — DX: Other congenital valgus deformities of feet: Q66.6

## 2015-01-01 HISTORY — DX: Congenital metatarsus primus varus, unspecified foot: Q66.219

## 2015-01-01 HISTORY — PX: METATARSAL OSTEOTOMY WITH BUNIONECTOMY: SHX5662

## 2015-01-01 SURGERY — BUNIONECTOMY, WITH METATARSAL OSTEOTOMY
Anesthesia: Regional | Site: Foot | Laterality: Right

## 2015-01-01 MED ORDER — OXYCODONE HCL 5 MG PO TABS: 5.0000 mg | ORAL_TABLET | ORAL | Status: DC | PRN

## 2015-01-01 MED ORDER — LACTATED RINGERS IV SOLN
INTRAVENOUS | Status: DC
  Administered 2015-01-01 (×2): via INTRAVENOUS

## 2015-01-01 MED ORDER — SODIUM CHLORIDE 0.9 % IV SOLN: INTRAVENOUS | Status: DC

## 2015-01-01 MED ORDER — CEFAZOLIN SODIUM-DEXTROSE 2-3 GM-% IV SOLR
2.0000 g | INTRAVENOUS | Status: AC
  Administered 2015-01-01: 2 g via INTRAVENOUS

## 2015-01-01 MED ORDER — FENTANYL CITRATE (PF) 100 MCG/2ML IJ SOLN
INTRAMUSCULAR | Status: AC
  Filled 2015-01-01: qty 4

## 2015-01-01 MED ORDER — LIDOCAINE HCL (CARDIAC) 20 MG/ML IV SOLN
INTRAVENOUS | Status: AC
  Filled 2015-01-01: qty 5

## 2015-01-01 MED ORDER — BUPIVACAINE-EPINEPHRINE (PF) 0.5% -1:200000 IJ SOLN
INTRAMUSCULAR | Status: AC
  Filled 2015-01-01: qty 90

## 2015-01-01 MED ORDER — MEPERIDINE HCL 25 MG/ML IJ SOLN: 6.2500 mg | INTRAMUSCULAR | Status: DC | PRN

## 2015-01-01 MED ORDER — GLYCOPYRROLATE 0.2 MG/ML IJ SOLN: 0.2000 mg | Freq: Once | INTRAMUSCULAR | Status: DC | PRN

## 2015-01-01 MED ORDER — FENTANYL CITRATE (PF) 100 MCG/2ML IJ SOLN
INTRAMUSCULAR | Status: AC
  Filled 2015-01-01: qty 2

## 2015-01-01 MED ORDER — ONDANSETRON HCL 4 MG/2ML IJ SOLN
INTRAMUSCULAR | Status: AC
  Filled 2015-01-01: qty 2

## 2015-01-01 MED ORDER — CHLORHEXIDINE GLUCONATE 4 % EX LIQD: 60.0000 mL | Freq: Once | CUTANEOUS | Status: DC

## 2015-01-01 MED ORDER — HYDROMORPHONE HCL 1 MG/ML IJ SOLN: 0.2500 mg | INTRAMUSCULAR | Status: DC | PRN

## 2015-01-01 MED ORDER — 0.9 % SODIUM CHLORIDE (POUR BTL) OPTIME
TOPICAL | Status: DC | PRN
  Administered 2015-01-01: 200 mL

## 2015-01-01 MED ORDER — DEXAMETHASONE SODIUM PHOSPHATE 10 MG/ML IJ SOLN
INTRAMUSCULAR | Status: DC | PRN
  Administered 2015-01-01: 10 mg via INTRAVENOUS

## 2015-01-01 MED ORDER — ONDANSETRON HCL 4 MG/2ML IJ SOLN
INTRAMUSCULAR | Status: DC | PRN
  Administered 2015-01-01: 4 mg via INTRAVENOUS

## 2015-01-01 MED ORDER — LIDOCAINE HCL (CARDIAC) 20 MG/ML IV SOLN
INTRAVENOUS | Status: DC | PRN
  Administered 2015-01-01: 30 mg via INTRAVENOUS

## 2015-01-01 MED ORDER — PROPOFOL 10 MG/ML IV BOLUS
INTRAVENOUS | Status: DC | PRN
Start: 2015-01-01 — End: 2015-01-01
  Administered 2015-01-01: 200 mg via INTRAVENOUS

## 2015-01-01 MED ORDER — BUPIVACAINE-EPINEPHRINE (PF) 0.5% -1:200000 IJ SOLN
INTRAMUSCULAR | Status: DC | PRN
  Administered 2015-01-01: 20 mL via PERINEURAL

## 2015-01-01 MED ORDER — SCOPOLAMINE 1 MG/3DAYS TD PT72: 1.0000 | MEDICATED_PATCH | Freq: Once | TRANSDERMAL | Status: DC | PRN

## 2015-01-01 MED ORDER — MIDAZOLAM HCL 2 MG/2ML IJ SOLN
INTRAMUSCULAR | Status: AC
  Filled 2015-01-01: qty 2

## 2015-01-01 MED ORDER — MIDAZOLAM HCL 2 MG/2ML IJ SOLN
INTRAMUSCULAR | Status: AC
  Filled 2015-01-01: qty 4

## 2015-01-01 MED ORDER — OXYCODONE HCL 5 MG/5ML PO SOLN: 5.0000 mg | Freq: Once | ORAL | Status: DC | PRN

## 2015-01-01 MED ORDER — PROPOFOL 500 MG/50ML IV EMUL
INTRAVENOUS | Status: AC
  Filled 2015-01-01: qty 50

## 2015-01-01 MED ORDER — FENTANYL CITRATE (PF) 100 MCG/2ML IJ SOLN
50.0000 ug | INTRAMUSCULAR | Status: DC | PRN
  Administered 2015-01-01: 100 ug via INTRAVENOUS
  Administered 2015-01-01: 50 ug via INTRAVENOUS

## 2015-01-01 MED ORDER — DEXAMETHASONE SODIUM PHOSPHATE 10 MG/ML IJ SOLN
INTRAMUSCULAR | Status: AC
  Filled 2015-01-01: qty 1

## 2015-01-01 MED ORDER — OXYCODONE HCL 5 MG PO TABS: 5.0000 mg | ORAL_TABLET | Freq: Once | ORAL | Status: DC | PRN

## 2015-01-01 MED ORDER — BUPIVACAINE-EPINEPHRINE 0.5% -1:200000 IJ SOLN
INTRAMUSCULAR | Status: DC | PRN
  Administered 2015-01-01: 10 mL

## 2015-01-01 MED ORDER — CEFAZOLIN SODIUM-DEXTROSE 2-3 GM-% IV SOLR
INTRAVENOUS | Status: AC
  Filled 2015-01-01: qty 50

## 2015-01-01 MED ORDER — BUPIVACAINE HCL (PF) 0.5 % IJ SOLN
INTRAMUSCULAR | Status: AC
  Filled 2015-01-01: qty 90

## 2015-01-01 MED ORDER — MIDAZOLAM HCL 2 MG/2ML IJ SOLN
1.0000 mg | INTRAMUSCULAR | Status: DC | PRN
  Administered 2015-01-01: 2 mg via INTRAVENOUS
  Administered 2015-01-01: 1 mg via INTRAVENOUS

## 2015-01-01 SURGICAL SUPPLY — 70 items
BANDAGE ESMARK 6X9 LF (GAUZE/BANDAGES/DRESSINGS) ×1 IMPLANT
BIT DRILL 2X3.5 HAND QK RELEAS (BIT) IMPLANT
BIT DRILL 2X3.5 QUICK RELEASE (BIT) ×2
BLADE AVERAGE 25X9 (BLADE) ×2 IMPLANT
BLADE LONG MED 25X9 (BLADE) IMPLANT
BLADE MICRO SAGITTAL (BLADE) IMPLANT
BLADE SURG 15 STRL LF DISP TIS (BLADE) ×3 IMPLANT
BLADE SURG 15 STRL SS (BLADE) ×4
BNDG CMPR 9X6 STRL LF SNTH (GAUZE/BANDAGES/DRESSINGS) ×1
BNDG COHESIVE 4X5 TAN STRL (GAUZE/BANDAGES/DRESSINGS) ×2 IMPLANT
BNDG COHESIVE 6X5 TAN STRL LF (GAUZE/BANDAGES/DRESSINGS) IMPLANT
BNDG CONFORM 3 STRL LF (GAUZE/BANDAGES/DRESSINGS) ×2 IMPLANT
BNDG ESMARK 6X9 LF (GAUZE/BANDAGES/DRESSINGS) ×2
CHLORAPREP W/TINT 26ML (MISCELLANEOUS) ×2 IMPLANT
COUNTERSINK MULTI SCREW (BIT) ×2
COVER BACK TABLE 60X90IN (DRAPES) ×2 IMPLANT
CUFF TOURNIQUET SINGLE 34IN LL (TOURNIQUET CUFF) ×1 IMPLANT
DRAPE EXTREMITY T 121X128X90 (DRAPE) ×2 IMPLANT
DRAPE OEC MINIVIEW 54X84 (DRAPES) ×2 IMPLANT
DRAPE U-SHAPE 47X51 STRL (DRAPES) ×2 IMPLANT
DRSG MEPITEL 4X7.2 (GAUZE/BANDAGES/DRESSINGS) ×2 IMPLANT
ELECT REM PT RETURN 9FT ADLT (ELECTROSURGICAL) ×2
ELECTRODE REM PT RTRN 9FT ADLT (ELECTROSURGICAL) ×1 IMPLANT
GAUZE SPONGE 4X4 12PLY STRL (GAUZE/BANDAGES/DRESSINGS) ×2 IMPLANT
GLOVE BIO SURGEON STRL SZ8 (GLOVE) ×2 IMPLANT
GLOVE BIOGEL PI IND STRL 7.0 (GLOVE) IMPLANT
GLOVE BIOGEL PI IND STRL 8 (GLOVE) ×2 IMPLANT
GLOVE BIOGEL PI INDICATOR 7.0 (GLOVE) ×1
GLOVE BIOGEL PI INDICATOR 8 (GLOVE) ×1
GLOVE ECLIPSE 6.5 STRL STRAW (GLOVE) ×1 IMPLANT
GLOVE ECLIPSE 7.5 STRL STRAW (GLOVE) ×1 IMPLANT
GLOVE EXAM NITRILE MD LF STRL (GLOVE) IMPLANT
GOWN STRL REUS W/ TWL LRG LVL3 (GOWN DISPOSABLE) ×1 IMPLANT
GOWN STRL REUS W/ TWL XL LVL3 (GOWN DISPOSABLE) ×2 IMPLANT
GOWN STRL REUS W/TWL LRG LVL3 (GOWN DISPOSABLE) ×2
GOWN STRL REUS W/TWL XL LVL3 (GOWN DISPOSABLE) ×4
GUIDEWIRE .08 (WIRE) IMPLANT
K-WIRE .054X4 (WIRE) ×1 IMPLANT
NDL HYPO 25X1 1.5 SAFETY (NEEDLE) IMPLANT
NEEDLE HYPO 25X1 1.5 SAFETY (NEEDLE) ×2 IMPLANT
NS IRRIG 1000ML POUR BTL (IV SOLUTION) ×2 IMPLANT
PACK BASIN DAY SURGERY FS (CUSTOM PROCEDURE TRAY) ×2 IMPLANT
PAD CAST 4YDX4 CTTN HI CHSV (CAST SUPPLIES) ×1 IMPLANT
PADDING CAST ABS 4INX4YD NS (CAST SUPPLIES)
PADDING CAST ABS COTTON 4X4 ST (CAST SUPPLIES) IMPLANT
PADDING CAST COTTON 4X4 STRL (CAST SUPPLIES) ×2
PENCIL BUTTON HOLSTER BLD 10FT (ELECTRODE) ×2 IMPLANT
SANITIZER HAND PURELL 535ML FO (MISCELLANEOUS) ×2 IMPLANT
SCREW BONE LAG 2.3X16MM HEXA (Screw) IMPLANT
SCREW BONE NL 2.3X20MM HEXA (Screw) IMPLANT
SCREW COUNTERSINK MULTI SCREW (BIT) IMPLANT
SCREW LAG 2.3X16 (Screw) ×2 IMPLANT
SCREW NONLOCK 2.3X20MM (Screw) ×2 IMPLANT
SHEET MEDIUM DRAPE 40X70 STRL (DRAPES) ×2 IMPLANT
SLEEVE SCD COMPRESS KNEE MED (MISCELLANEOUS) ×2 IMPLANT
SPONGE LAP 18X18 X RAY DECT (DISPOSABLE) ×2 IMPLANT
STOCKINETTE 6  STRL (DRAPES) ×1
STOCKINETTE 6 STRL (DRAPES) ×1 IMPLANT
SUCTION FRAZIER TIP 10 FR DISP (SUCTIONS) ×1 IMPLANT
SUT ETHIBOND 3-0 V-5 (SUTURE) IMPLANT
SUT ETHILON 3 0 PS 1 (SUTURE) ×2 IMPLANT
SUT MNCRL AB 3-0 PS2 18 (SUTURE) ×2 IMPLANT
SUT VIC AB 0 SH 27 (SUTURE) IMPLANT
SUT VIC AB 2-0 SH 27 (SUTURE) ×2
SUT VIC AB 2-0 SH 27XBRD (SUTURE) IMPLANT
SYR BULB 3OZ (MISCELLANEOUS) ×2 IMPLANT
SYR CONTROL 10ML LL (SYRINGE) ×1 IMPLANT
TOWEL OR 17X24 6PK STRL BLUE (TOWEL DISPOSABLE) ×3 IMPLANT
TUBE CONNECTING 20X1/4 (TUBING) ×1 IMPLANT
UNDERPAD 30X30 (UNDERPADS AND DIAPERS) ×2 IMPLANT

## 2015-01-01 NOTE — Anesthesia Procedure Notes (Addendum)
Anesthesia Regional Block:  Adductor canal block  Pre-Anesthetic Checklist: ,, timeout performed, Correct Patient, Correct Site, Correct Laterality, Correct Procedure, Correct Position, site marked, Risks and benefits discussed,  Surgical consent,  Pre-op evaluation,  At surgeon's request and post-op pain management  Laterality: Right and Lower  Prep: chloraprep       Needles:  Injection technique: Single-shot  Needle Type: Echogenic Needle     Needle Length: 9cm 9 cm Needle Gauge: 21 and 21 G    Additional Needles:  Procedures: ultrasound guided (picture in chart) Adductor canal block Narrative:  Start time: 01/01/2015 7:06 AM End time: 01/01/2015 7:10 AM Injection made incrementally with aspirations every 5 mL.  Performed by: Personally  Anesthesiologist: CREWS, DAVID   Procedure Name: LMA Insertion Date/Time: 01/01/2015 7:32 AM Performed by: Evia Goldsmith D Pre-anesthesia Checklist: Patient identified, Emergency Drugs available, Suction available and Patient being monitored Patient Re-evaluated:Patient Re-evaluated prior to inductionOxygen Delivery Method: Circle System Utilized Preoxygenation: Pre-oxygenation with 100% oxygen Intubation Type: IV induction Ventilation: Mask ventilation without difficulty LMA: LMA inserted LMA Size: 4.0 Number of attempts: 1 Airway Equipment and Method: Bite block Placement Confirmation: positive ETCO2 Tube secured with: Tape Dental Injury: Teeth and Oropharynx as per pre-operative assessment       Right saphenous block image.

## 2015-01-01 NOTE — Op Note (Signed)
NAME:  Daniel Mcclure, Daniel Mcclure NO.:  [id number]  MEDICAL RECORD NO.:  [id number]  LOCATION:                                 FACILITY:  PHYSICIAN:  Wylene Simmer, MD             DATE OF BIRTH:  DATE OF PROCEDURE:  01/01/2015 DATE OF DISCHARGE:                              OPERATIVE REPORT   PREOPERATIVE DIAGNOSIS:  Right foot bunion with hallux valgus and metatarsus primus varus.  POSTOPERATIVE DIAGNOSIS:  Right foot bunion with hallux valgus and metatarsus primus varus.  PROCEDURES: 1. Right first metatarsal Scarf osteotomy. 2. Right modified McBride bunionectomy through a separate incision. 3. Right foot AP and lateral radiographs.  SURGEON:  Wylene Simmer, M.D.  ANESTHESIA:  General, regional.  ESTIMATED BLOOD LOSS:  Minimal.  TOURNIQUET TIME:  60 minutes at 250 mmHg.  COMPLICATIONS:  None apparent.  DISPOSITION:  Extubated, awake, and stable to recovery.  INDICATIONS FOR PROCEDURE:  The patient is a 34 year old male who has a painful right foot bunion deformity.  He presents today for operative treatment of this painful condition, having failed nonoperative treatment to date.  He understands the risks and benefits, the alternative treatment options, and elects surgical treatment.  He specifically understands risks of bleeding, infection, nerve damage, blood clots, need for additional surgery, continued pain, recurrence of deformity, amputation, and death.  PROCEDURE IN DETAIL:  After preoperative consent was obtained and the correct operative site was identified, the patient was brought to the operating room and placed supine on the operating table.  General anesthesia was induced.  Preoperative antibiotics were administered. Surgical time-out was taken.  The right lower extremity was prepped and draped in standard sterile fashion with tourniquet around the thigh. The extremity was exsanguinated and the tourniquet was inflated to 250 mmHg.  A  longitudinal incision was then made at the dorsum of the first webspace.  Sharp dissection was carried down through skin and subcutaneous tissue.  The intermetatarsal ligament was identified and was divided under direct vision.  An arthrotomy was then made in the lateral joint capsule between the sesamoid and the metatarsal head. Small perforations were made in the lateral joint capsule.  The hallux could then be positioned in 20 degrees of varus.  Attention was then turned to the medial aspect of the forefoot.  A longitudinal incision was made and sharp dissection was carried down through skin, subcutaneous tissue.  The medial joint capsule was incised in line with its fibers and elevated plantarly and dorsally.  The metatarsal shaft was exposed.  The hypertrophic medial eminence was resected in line with the metatarsal shaft.  K-wires were then placed in the metatarsal shaft to mark the corners of the Scarf osteotomy.  The osteotomy was made with an oscillating saw, taking a small wedge of bone proximally.  The patient was noted to have an increased distal metatarsal articular angle.  A second cut was made to resect more bone laterally to allow correction of the DMAA.  The osteotomy was then mobilized and the head of the metatarsal was translated laterally and angled to correct the DMAA.  The osteotomy was provisionally held  with a tenaculum.  AP and lateral radiographs confirmed appropriate correction of the intermetatarsal and hallux valgus angles.  The osteotomy was then fixed with two 2.3 mm Acumed screws.  The overhanging bone was trimmed with an oscillating saw.  The sesamoids were still noted to be translated slightly laterally.  Attention was returned to the dorsal incision where the adductor tendon was released from the sesamoid and the base of the proximal phalanx.  This corrected the sesamoid position appropriately.  Wound was irrigated copiously.  The medial joint  capsule was then repaired with imbricating sutures of 2-0 Vicryl.  Redundant capsular tissue was excised.  The surgical incisions were closed with Monocryl and nylon.  Marcaine 0.5% with epinephrine was infiltrated into the incisions medially and dorsally.  Sterile dressings were applied followed by a bunion wrap.  Tourniquet was released at 1 hour.  Final AP and lateral radiographs confirmed appropriate position and length of all hardware and appropriate correction of the hallux valgus and intermetatarsal angles.  The patient was then awakened from anesthesia and transported to the recovery room in stable condition.  FOLLOWUP PLAN:  The patient will be weightbearing as tolerated on his right heel and a Darco wedge style shoe.  He will follow up with me in 2 weeks for suture removal and conversion to a silicone toe spacer.  RADIOGRAPHS:  AP and lateral radiographs of the right foot were obtained intraoperatively.  These show interval correction of hallux valgus and intermetatarsal angles.  Hardware is appropriately positioned and of the appropriate length.  No other acute injuries are noted.     Wylene Simmer, MD     JH/MEDQ  D:  01/01/2015  T:  01/01/2015  Job:  [id number]

## 2015-01-01 NOTE — Progress Notes (Signed)
Assisted Dr. Al Corpus with right, ultrasound guided, popliteal/saphenous block. Side rails up, monitors on throughout procedure. See vital signs in flow sheet. Tolerated Procedure well.

## 2015-01-01 NOTE — Brief Op Note (Signed)
01/01/2015  8:59 AM  PATIENT:  Daniel Mcclure.  34 y.o. male  PRE-OPERATIVE DIAGNOSIS:  Right foot bunion with hallux valgus and metatarsus primus varus  POST-OPERATIVE DIAGNOSIS:  Same  Procedure(s): 1.  Right FIRST METATARSAL SCARF OSTEOTOMY 2.  Right MODIFIED MCBRIDE BUNIONECTOMY  (separate incision) 3.  Right foot AP and lateral xrays  SURGEON:  Wylene Simmer, MD  ASSISTANT: n/a  ANESTHESIA:   General, regional  EBL:  minimal   TOURNIQUET:   Total Tourniquet Time Documented: Thigh (Right) - 60 minutes Total: Thigh (Right) - 60 minutes  COMPLICATIONS:  None apparent  DISPOSITION:  Extubated, awake and stable to recovery.  DICTATION ID:  [id number]

## 2015-01-01 NOTE — H&P (Signed)
Daniel Mcclure. is an 34 y.o. male.   Chief Complaint: left foot bunion HPI:  34 y/o male with painful right foot bunion deformity due to hallux valgus and metatarsus primus varus.  He presents now for operative treatment.  Past Medical History  Diagnosis Date  . Hallux valgus due to metatarsus primus varus 12/2014    right    Past Surgical History  Procedure Laterality Date  . Knee surgery Left 1992  . Bunionectomy Left     Family History  Problem Relation Age of Onset  . Hypertension Father   . Stroke Paternal Grandfather   . Cancer Neg Hx    Social History:  reports that he has never smoked. He has never used smokeless tobacco. He reports that he does not drink alcohol or use illicit drugs.  Allergies: No Known Allergies  No prescriptions prior to admission    No results found for this or any previous visit (from the past 48 hour(s)). No results found.  ROS  No recent f/c/n/v/wt loss.  Blood pressure 122/78, pulse 77, temperature 99.8 F (37.7 C), temperature source Oral, resp. rate 17, height 5' 10"  (1.778 m), weight 79.833 kg (176 lb), SpO2 100 %. Physical Exam  wn wd male in nad.  A and O x 4.  Mood and affect normal.  EOMI.  resp unlabored.  R foot with bunion deofmity.  Skin o/w healthy and intact.  Sens to LT intact.  2+ dp and pt pulses.  5/5 strength in PF and DF of th eankle and toes.  Assessment/Plan R foot bunion - to OR for scarf and mod mcbride.  The risks and benefits of the alternative treatment options have been discussed in detail.  The patient wishes to proceed with surgery and specifically understands risks of bleeding, infection, nerve damage, blood clots, need for additional surgery, amputation and death.   Wylene Simmer 01-16-15, 7:26 AM

## 2015-01-01 NOTE — Transfer of Care (Signed)
Immediate Anesthesia Transfer of Care Note  Patient: Daniel Mcclure.  Procedure(s) Performed: Procedure(s): FIRST METATARSAL SCARF OSTEOTOMY, MODIFIED MCBRIDE BUNIONECTOMY (Right)  Patient Location: PACU  Anesthesia Type:GA combined with regional for post-op pain  Level of Consciousness: awake and patient cooperative  Airway & Oxygen Therapy: Patient Spontanous Breathing and Patient connected to face mask oxygen  Post-op Assessment: Report given to RN and Post -op Vital signs reviewed and stable  Post vital signs: Reviewed and stable  Last Vitals:  Filed Vitals:   01/01/15 0715  BP:   Pulse: 77  Temp:   Resp: 17    Complications: No apparent anesthesia complications

## 2015-01-01 NOTE — Anesthesia Postprocedure Evaluation (Signed)
  Anesthesia Post-op Note  Patient: Daniel Mcclure.  Procedure(s) Performed: Procedure(s): FIRST METATARSAL SCARF OSTEOTOMY, MODIFIED MCBRIDE BUNIONECTOMY (Right)  Patient Location: PACU  Anesthesia Type:GA combined with regional for post-op pain  Level of Consciousness: awake and alert   Airway and Oxygen Therapy: Patient Spontanous Breathing  Post-op Pain: none  Post-op Assessment: Post-op Vital signs reviewed     RLE Motor Response: Purposeful movement RLE Sensation: Numbness      Post-op Vital Signs: stable  Last Vitals:  Filed Vitals:   01/01/15 1010  BP: 157/81  Pulse: 73  Temp: 37.3 C  Resp: 16    Complications: No apparent anesthesia complications

## 2015-01-01 NOTE — Anesthesia Preprocedure Evaluation (Addendum)
Anesthesia Evaluation  Patient identified by MRN, date of birth, ID band Patient awake    Reviewed: Allergy & Precautions, NPO status , Patient's Chart, lab work & pertinent test results  Airway Mallampati: I  TM Distance: >3 FB Neck ROM: Full    Dental  (+) Teeth Intact, Dental Advisory Given   Pulmonary    breath sounds clear to auscultation       Cardiovascular  Rhythm:Regular Rate:Normal     Neuro/Psych    GI/Hepatic   Endo/Other    Renal/GU      Musculoskeletal   Abdominal   Peds  Hematology   Anesthesia Other Findings   Reproductive/Obstetrics                            Anesthesia Physical Anesthesia Plan  ASA: I  Anesthesia Plan: General and Regional   Post-op Pain Management:    Induction: Intravenous  Airway Management Planned: LMA  Additional Equipment:   Intra-op Plan:   Post-operative Plan: Extubation in OR  Informed Consent: I have reviewed the patients History and Physical, chart, labs and discussed the procedure including the risks, benefits and alternatives for the proposed anesthesia with the patient or authorized representative who has indicated his/her understanding and acceptance.   Dental advisory given  Plan Discussed with: CRNA, Anesthesiologist and Surgeon  Anesthesia Plan Comments:         Anesthesia Quick Evaluation

## 2015-01-01 NOTE — Discharge Instructions (Addendum)
Wylene Simmer, MD [place]  Please read the following information regarding your care after surgery.  Medications  You only need a prescription for the narcotic pain medicine (ex. oxycodone, Percocet, Norco).  All of the other medicines listed below are available over the counter. X acetominophen (Tylenol) 650 mg every 4-6 hours as you need for minor pain X oxycodone as prescribed for moderate to severe pain ?   Narcotic pain medicine (ex. oxycodone, Percocet, Vicodin) will cause constipation.  To prevent this problem, take the following medicines while you are taking any pain medicine. X docusate sodium (Colace) 100 mg twice a day X senna (Senokot) 2 tablets twice a day  Weight Bearing ? Bear weight when you are able on your operated leg or foot. X Bear weight only on the heel of your operated foot in the post-op shoe. ? Do not bear any weight on the operated leg or foot.  Cast / Splint / Dressing X Keep your splint or cast clean and dry.  Dont put anything (coat hanger, pencil, etc) down inside of it.  If it gets damp, use a hair dryer on the cool setting to dry it.  If it gets soaked, call the office to schedule an appointment for a cast change. ? Remove your dressing 3 days after surgery and cover the incisions with dry dressings.    After your dressing, cast or splint is removed; you may shower, but do not soak or scrub the wound.  Allow the water to run over it, and then gently pat it dry.  Swelling It is normal for you to have swelling where you had surgery.  To reduce swelling and pain, keep your toes above your nose for at least 3 days after surgery.  It may be necessary to keep your foot or leg elevated for several weeks.  If it hurts, it should be elevated.  Follow Up Call my office at [phone] when you are discharged from the hospital or surgery center to schedule an appointment to be seen two weeks after surgery.  Call my office at [phone] if you  develop a fever >101.5 F, nausea, vomiting, bleeding from the surgical site or severe pain.     Regional Anesthesia Blocks  1. Numbness or the inability to move the "blocked" extremity may last from 3-48 hours after placement. The length of time depends on the medication injected and your individual response to the medication. If the numbness is not going away after 48 hours, call your surgeon.  2. The extremity that is blocked will need to be protected until the numbness is gone and the  Strength has returned. Because you cannot feel it, you will need to take extra care to avoid injury. Because it may be weak, you may have difficulty moving it or using it. You may not know what position it is in without looking at it while the block is in effect.  3. For blocks in the legs and feet, returning to weight bearing and walking needs to be done carefully. You will need to wait until the numbness is entirely gone and the strength has returned. You should be able to move your leg and foot normally before you try and bear weight or walk. You will need someone to be with you when you first try to ensure you do not fall and possibly risk injury.  4. Bruising and tenderness at the needle site are common side effects and will resolve in a few days.  5. Persistent numbness or new problems with movement should be communicated to the surgeon or the [place] [phone] [place] [phone]).    Post Anesthesia Home Care Instructions  Activity: Get plenty of rest for the remainder of the day. A responsible adult should stay with you for 24 hours following the procedure.  For the next 24 hours, DO NOT: -Drive a car -Paediatric nurse -Drink alcoholic beverages -Take any medication unless instructed by your physician -Make any legal decisions or sign important papers.  Meals: Start with liquid foods such as gelatin or soup. Progress to regular foods as tolerated.  Avoid greasy, spicy, heavy foods. If nausea and/or vomiting occur, drink only clear liquids until the nausea and/or vomiting subsides. Call your physician if vomiting continues.  Special Instructions/Symptoms: Your throat may feel dry or sore from the anesthesia or the breathing tube placed in your throat during surgery. If this causes discomfort, gargle with warm salt water. The discomfort should disappear within 24 hours.  If you had a scopolamine patch placed behind your ear for the management of post- operative nausea and/or vomiting:  1. The medication in the patch is effective for 72 hours, after which it should be removed.  Wrap patch in a tissue and discard in the trash. Wash hands thoroughly with soap and water. 2. You may remove the patch earlier than 72 hours if you experience unpleasant side effects which may include dry mouth, dizziness or visual disturbances. 3. Avoid touching the patch. Wash your hands with soap and water after contact with the patch.

## 2015-01-02 ENCOUNTER — Encounter (HOSPITAL_BASED_OUTPATIENT_CLINIC_OR_DEPARTMENT_OTHER): Payer: Self-pay | Admitting: Orthopedic Surgery

## 2015-01-30 ENCOUNTER — Telehealth: Payer: Self-pay | Admitting: Physician Assistant

## 2015-01-30 NOTE — Telephone Encounter (Signed)
LM for pt to call and schedule flu shot or update records.

## 2015-03-30 ENCOUNTER — Telehealth: Payer: Self-pay | Admitting: Physician Assistant

## 2015-03-30 NOTE — Telephone Encounter (Signed)
LM to update flu record. Not due for cpe until 09/2015.

## 2015-03-30 NOTE — Telephone Encounter (Signed)
Pt called back in to decline his flu vac.

## 2015-05-25 NOTE — Telephone Encounter (Signed)
Called patient to notify him that charge was voided  but no voicemail set up and no answer

## 2015-11-23 DIAGNOSIS — L662 Folliculitis decalvans: Secondary | ICD-10-CM | POA: Diagnosis not present

## 2016-01-07 DIAGNOSIS — Z23 Encounter for immunization: Secondary | ICD-10-CM | POA: Diagnosis not present

## 2016-04-04 DIAGNOSIS — L662 Folliculitis decalvans: Secondary | ICD-10-CM | POA: Diagnosis not present

## 2016-12-08 DIAGNOSIS — Z23 Encounter for immunization: Secondary | ICD-10-CM | POA: Diagnosis not present

## 2017-03-11 DIAGNOSIS — J111 Influenza due to unidentified influenza virus with other respiratory manifestations: Secondary | ICD-10-CM | POA: Diagnosis not present

## 2017-03-30 ENCOUNTER — Encounter: Payer: Self-pay | Admitting: Family Medicine

## 2017-03-30 ENCOUNTER — Ambulatory Visit: Payer: BLUE CROSS/BLUE SHIELD | Admitting: Family Medicine

## 2017-03-30 VITALS — BP 122/70 | HR 70 | Temp 98.3°F | Ht 70.0 in | Wt 172.2 lb

## 2017-03-30 DIAGNOSIS — G478 Other sleep disorders: Secondary | ICD-10-CM

## 2017-03-30 DIAGNOSIS — J029 Acute pharyngitis, unspecified: Secondary | ICD-10-CM

## 2017-03-30 LAB — POCT RAPID STREP A (OFFICE): Rapid Strep A Screen: NEGATIVE

## 2017-03-30 NOTE — Patient Instructions (Addendum)
Your strep is negative- no strep throat.  You likely have a cold- rest, use OTC cold medications as needed for your symptoms Please let me know if you are not feeling better in the next few days- Sooner if worse.  I am going to set you up for a sleep study to determine what is causing you to awaken feeling short of breath.  However, your tonsils are actually quite small so I am afraid that a tonsillectomy is unlikely to be helpful for you

## 2017-03-30 NOTE — Progress Notes (Signed)
[facility] at [facility] [address], [place], [place] [address] [place] [phone]  Date:  03/30/2017   Name:  Daniel Mcclure.   DOB:  06-Oct-1980   MRN:  [id number]  PCP:  Brunetta Jeans, PA-C    Chief Complaint: Nasal Congestion (c/o prod cough with yellow muscus. )   History of Present Illness:  Daniel Mcclure. is a 37 y.o. very pleasant male patient who presents with the following:  Pt of [name], here today for a sick visit   His youngest daughter is in daycare and has been sick recently He has noted chest congestion, cough, intermittent ST for a few days- maybe 2 days now He does not actually have much of a ST right now He wonders about getting his tonsils out. He may wake up gasping for air, but he does not snore per his wife. He sometimes feels like his sleep is not of good quality and he feels tired during the day  Ill for about 2 days No fever noted No GI symptoms   He is otherwise generally in good health  No meds, no allergies   Patient Active Problem List   Diagnosis Date Noted  . Visit for preventive health examination 10/14/2014    Past Medical History:  Diagnosis Date  . Hallux valgus due to metatarsus primus varus 12/2014   right    Past Surgical History:  Procedure Laterality Date  . BUNIONECTOMY Left   . KNEE SURGERY Left 1992  . METATARSAL OSTEOTOMY WITH BUNIONECTOMY Right 01/01/2015   Procedure: FIRST METATARSAL SCARF OSTEOTOMY, MODIFIED MCBRIDE BUNIONECTOMY;  Surgeon: Wylene Simmer, MD;  Location: [facility];  Service: Orthopedics;  Laterality: Right;    Social History   Tobacco Use  . Smoking status: Never Smoker  . Smokeless tobacco: Never Used  Substance Use Topics  . Alcohol use: No  . Drug use: No    Family History  Problem Relation Age of Onset  . Hypertension Father   . Stroke Paternal Grandfather   . Cancer Neg Hx     No Known Allergies  Medication  list has been reviewed and updated.  Current Outpatient Medications on File Prior to Visit  Medication Sig Dispense Refill  . Multiple Vitamin (MULTIVITAMIN) tablet Take 1 tablet by mouth daily.     No current facility-administered medications on file prior to visit.     Review of Systems:  As per HPI- otherwise negative.   Physical Examination: Vitals:   03/30/17 1501  BP: 122/70  Pulse: 70  Temp: 98.3 F (36.8 C)  SpO2: 98%   Vitals:   03/30/17 1501  Weight: 172 lb 3.2 oz (78.1 kg)  Height: 5' 10"  (1.778 m)   Body mass index is 24.71 kg/m. Ideal Body Weight: Weight in (lb) to have BMI = 25: 173.9  GEN: WDWN, NAD, Non-toxic, A & O x 3, fit build, looks well  HEENT: Atraumatic, Normocephalic. Neck supple. No masses, No LAD.  Bilateral TM wnl, oropharynx normal.  PEERL,EOMI.  His tonsils are actually quite small bilaterally  Ears and Nose: No external deformity. CV: RRR, No M/G/R. No JVD. No thrill. No extra heart sounds. PULM: CTA B, no wheezes, crackles, rhonchi. No retractions. No resp. distress. No accessory muscle use. EXTR: No c/c/e NEURO Normal gait.  PSYCH: Normally interactive. Conversant. Not depressed or anxious appearing.  Calm demeanor.   Results for orders placed or performed in  visit on 03/30/17  POCT rapid strep A  Result Value Ref Range   Rapid Strep A Screen Negative Negative     Assessment and Plan: Sore throat - Plan: POCT rapid strep A  Non-restorative sleep - Plan: Ambulatory referral to Neurology  Negative strep today Daniel Mcclure had the idea of getting his tonsils out, thinking this would be beneficial for him.  However on exam his tonsils seem to be quite small- explained that I do not think an ENT would want to do a tonsillectomy for him unless he perhaps has sleep apnea.  His sleep issue is really his biggest concern.  We will set up a sleep study for him   Signed Lamar Blinks, MD

## 2017-06-16 ENCOUNTER — Telehealth: Payer: Self-pay | Admitting: Physician Assistant

## 2017-06-16 NOTE — Telephone Encounter (Signed)
Copied from [place] [phone]. Topic: Quick Communication - Rx Refill/Question >> Jun 16, 2017  4:14 PM Daniel Mcclure wrote: Medication: tamiflu (short term) Has the patient contacted their pharmacy? Yes.   (Agent: If no, request that the patient contact the pharmacy for the refill.) Preferred Pharmacy (with phone number or street name): [facility], [place] [place] [address] [place] [address] Phone: [phone] Fax: [phone] Agent: Please be advised that RX refills may take up to 3 business days. We ask that you follow-up with your pharmacy.  Daughter just diagnosed with the flu this afternoon. Pediatrician recommended short supply for parents.

## 2017-06-17 DIAGNOSIS — J111 Influenza due to unidentified influenza virus with other respiratory manifestations: Secondary | ICD-10-CM | POA: Diagnosis not present

## 2017-06-17 DIAGNOSIS — Z20828 Contact with and (suspected) exposure to other viral communicable diseases: Secondary | ICD-10-CM | POA: Diagnosis not present

## 2017-09-04 ENCOUNTER — Encounter (HOSPITAL_BASED_OUTPATIENT_CLINIC_OR_DEPARTMENT_OTHER): Payer: Self-pay | Admitting: Radiology

## 2017-09-04 ENCOUNTER — Other Ambulatory Visit: Payer: Self-pay

## 2017-09-04 ENCOUNTER — Emergency Department (HOSPITAL_BASED_OUTPATIENT_CLINIC_OR_DEPARTMENT_OTHER): Payer: BLUE CROSS/BLUE SHIELD

## 2017-09-04 ENCOUNTER — Encounter: Payer: Self-pay | Admitting: Medical

## 2017-09-04 ENCOUNTER — Inpatient Hospital Stay (HOSPITAL_BASED_OUTPATIENT_CLINIC_OR_DEPARTMENT_OTHER)
Admission: EM | Admit: 2017-09-04 | Discharge: 2017-09-08 | DRG: 864 | Disposition: A | Discharge status: Home / Self Care | Payer: BLUE CROSS/BLUE SHIELD | Attending: Internal Medicine | Admitting: Internal Medicine

## 2017-09-04 ENCOUNTER — Encounter: Payer: Self-pay | Admitting: Family Medicine

## 2017-09-04 ENCOUNTER — Ambulatory Visit (INDEPENDENT_AMBULATORY_CARE_PROVIDER_SITE_OTHER): Payer: BLUE CROSS/BLUE SHIELD | Admitting: Medical

## 2017-09-04 ENCOUNTER — Encounter (HOSPITAL_BASED_OUTPATIENT_CLINIC_OR_DEPARTMENT_OTHER): Payer: Self-pay | Admitting: *Deleted

## 2017-09-04 VITALS — BP 115/50 | HR 85 | Temp 101.3°F | Resp 16 | Ht 70.0 in | Wt 164.6 lb

## 2017-09-04 DIAGNOSIS — Z9889 Other specified postprocedural states: Secondary | ICD-10-CM | POA: Diagnosis not present

## 2017-09-04 DIAGNOSIS — I4891 Unspecified atrial fibrillation: Secondary | ICD-10-CM | POA: Diagnosis not present

## 2017-09-04 DIAGNOSIS — R509 Fever, unspecified: Secondary | ICD-10-CM | POA: Diagnosis not present

## 2017-09-04 DIAGNOSIS — A419 Sepsis, unspecified organism: Secondary | ICD-10-CM | POA: Diagnosis not present

## 2017-09-04 DIAGNOSIS — N400 Enlarged prostate without lower urinary tract symptoms: Secondary | ICD-10-CM | POA: Diagnosis present

## 2017-09-04 DIAGNOSIS — I951 Orthostatic hypotension: Secondary | ICD-10-CM

## 2017-09-04 DIAGNOSIS — N390 Urinary tract infection, site not specified: Secondary | ICD-10-CM | POA: Diagnosis not present

## 2017-09-04 DIAGNOSIS — D72829 Elevated white blood cell count, unspecified: Secondary | ICD-10-CM | POA: Diagnosis not present

## 2017-09-04 DIAGNOSIS — R519 Headache, unspecified: Secondary | ICD-10-CM

## 2017-09-04 DIAGNOSIS — R911 Solitary pulmonary nodule: Secondary | ICD-10-CM | POA: Diagnosis not present

## 2017-09-04 DIAGNOSIS — M2010 Hallux valgus (acquired), unspecified foot: Secondary | ICD-10-CM | POA: Diagnosis not present

## 2017-09-04 DIAGNOSIS — R6510 Systemic inflammatory response syndrome (SIRS) of non-infectious origin without acute organ dysfunction: Principal | ICD-10-CM | POA: Diagnosis present

## 2017-09-04 DIAGNOSIS — R3911 Hesitancy of micturition: Secondary | ICD-10-CM | POA: Diagnosis not present

## 2017-09-04 DIAGNOSIS — M7918 Myalgia, other site: Secondary | ICD-10-CM | POA: Diagnosis not present

## 2017-09-04 DIAGNOSIS — R300 Dysuria: Secondary | ICD-10-CM | POA: Diagnosis not present

## 2017-09-04 DIAGNOSIS — E860 Dehydration: Secondary | ICD-10-CM | POA: Diagnosis not present

## 2017-09-04 DIAGNOSIS — Z79899 Other long term (current) drug therapy: Secondary | ICD-10-CM | POA: Diagnosis not present

## 2017-09-04 DIAGNOSIS — R3914 Feeling of incomplete bladder emptying: Secondary | ICD-10-CM | POA: Diagnosis not present

## 2017-09-04 DIAGNOSIS — R091 Pleurisy: Secondary | ICD-10-CM | POA: Diagnosis not present

## 2017-09-04 DIAGNOSIS — R51 Headache: Secondary | ICD-10-CM

## 2017-09-04 DIAGNOSIS — R52 Pain, unspecified: Secondary | ICD-10-CM

## 2017-09-04 DIAGNOSIS — R079 Chest pain, unspecified: Secondary | ICD-10-CM | POA: Diagnosis not present

## 2017-09-04 DIAGNOSIS — R5381 Other malaise: Secondary | ICD-10-CM | POA: Diagnosis not present

## 2017-09-04 DIAGNOSIS — R05 Cough: Secondary | ICD-10-CM | POA: Diagnosis not present

## 2017-09-04 DIAGNOSIS — R5383 Other fatigue: Secondary | ICD-10-CM | POA: Diagnosis not present

## 2017-09-04 DIAGNOSIS — M79672 Pain in left foot: Secondary | ICD-10-CM | POA: Diagnosis not present

## 2017-09-04 LAB — I-STAT CG4 LACTIC ACID, ED
LACTIC ACID, VENOUS: 3.08 mmol/L — AB (ref 0.5–1.9)
Lactic Acid, Venous: 2.85 mmol/L (ref 0.5–1.9)
Lactic Acid, Venous: 2.21 mmol/L (ref 0.5–1.9)

## 2017-09-04 LAB — CSF CELL COUNT WITH DIFFERENTIAL
RBC COUNT CSF: 0 /mm3
RBC COUNT CSF: 1 /mm3 — AB
TUBE #: 1
Tube #: 4
WBC CSF: 0 /mm3 (ref 0–5)
WBC, CSF: 0 /mm3 (ref 0–5)

## 2017-09-04 LAB — COMPREHENSIVE METABOLIC PANEL
ALT: 23 U/L (ref 0–44)
ANION GAP: 10 (ref 5–15)
AST: 27 U/L (ref 15–41)
Albumin: 4.3 g/dL (ref 3.5–5.0)
Alkaline Phosphatase: 49 U/L (ref 38–126)
BUN: 11 mg/dL (ref 6–20)
CHLORIDE: 101 mmol/L (ref 98–111)
CO2: 26 mmol/L (ref 22–32)
Calcium: 9.1 mg/dL (ref 8.9–10.3)
Creatinine, Ser: 1.09 mg/dL (ref 0.61–1.24)
GFR calc non Af Amer: >60 mL/min (ref >60)
Glucose, Bld: 124 mg/dL — ABNORMAL HIGH (ref 70–99)
Potassium: 3.8 mmol/L (ref 3.5–5.1)
SODIUM: 137 mmol/L (ref 135–145)
Total Bilirubin: 0.7 mg/dL (ref 0.3–1.2)
Total Protein: 7.9 g/dL (ref 6.5–8.1)

## 2017-09-04 LAB — URINALYSIS, ROUTINE W REFLEX MICROSCOPIC
Bilirubin Urine: NEGATIVE
Glucose, UA: NEGATIVE mg/dL
HGB URINE DIPSTICK: NEGATIVE
KETONES UR: 15 mg/dL — AB
Leukocytes, UA: NEGATIVE
NITRITE: NEGATIVE
PH: 6 (ref 5.0–8.0)
Protein, ur: 30 mg/dL — AB
Specific Gravity, Urine: 1.02 (ref 1.005–1.030)

## 2017-09-04 LAB — URINALYSIS, MICROSCOPIC (REFLEX)

## 2017-09-04 LAB — CBC WITH DIFFERENTIAL/PLATELET
BASOS PCT: 0 %
Band Neutrophils: 0 %
Basophils Absolute: 0 10*3/uL (ref 0.0–0.1)
Blasts: 0 %
EOS ABS: 0 10*3/uL (ref 0.0–0.7)
EOS PCT: 0 %
HCT: 43.3 % (ref 39.0–52.0)
Hemoglobin: 14.8 g/dL (ref 13.0–17.0)
LYMPHS PCT: 6 %
Lymphs Abs: 1.4 10*3/uL (ref 0.7–4.0)
MCH: 26.8 pg (ref 26.0–34.0)
MCHC: 34.2 g/dL (ref 30.0–36.0)
MCV: 78.3 fL (ref 78.0–100.0)
MONO ABS: 2.3 10*3/uL — AB (ref 0.1–1.0)
Metamyelocytes Relative: 0 %
Monocytes Relative: 10 %
Myelocytes: 0 %
NEUTROS ABS: 19.5 10*3/uL — AB (ref 1.7–7.7)
NEUTROS PCT: 84 %
NRBC: 0 /100{WBCs}
Platelets: 344 10*3/uL (ref 150–400)
Promyelocytes Relative: 0 %
RBC: 5.53 MIL/uL (ref 4.22–5.81)
RDW: 13.3 % (ref 11.5–15.5)
WBC: 23.2 10*3/uL — ABNORMAL HIGH (ref 4.0–10.5)

## 2017-09-04 LAB — TROPONIN I

## 2017-09-04 LAB — PROTEIN AND GLUCOSE, CSF
Glucose, CSF: 66 mg/dL (ref 40–70)
TOTAL PROTEIN, CSF: 56 mg/dL — AB (ref 15–45)

## 2017-09-04 MED ORDER — SODIUM CHLORIDE 0.9 % IV SOLN
INTRAVENOUS | Status: AC
  Administered 2017-09-04: 23:00:00 via INTRAVENOUS

## 2017-09-04 MED ORDER — MIDAZOLAM HCL 2 MG/2ML IJ SOLN
2.0000 mg | Freq: Once | INTRAMUSCULAR | Status: AC
  Administered 2017-09-04: 2 mg via INTRAVENOUS
  Filled 2017-09-04: qty 2

## 2017-09-04 MED ORDER — ENOXAPARIN SODIUM 40 MG/0.4ML ~~LOC~~ SOLN
40.0000 mg | SUBCUTANEOUS | Status: DC
  Administered 2017-09-04 – 2017-09-07 (×4): 40 mg via SUBCUTANEOUS
  Filled 2017-09-04 (×4): qty 0.4

## 2017-09-04 MED ORDER — SODIUM CHLORIDE 0.9 % IV BOLUS
1500.0000 mL | Freq: Once | INTRAVENOUS | Status: AC
  Administered 2017-09-04: 1500 mL via INTRAVENOUS

## 2017-09-04 MED ORDER — ONDANSETRON HCL 4 MG/2ML IJ SOLN: 4.0000 mg | Freq: Four times a day (QID) | INTRAMUSCULAR | Status: DC | PRN

## 2017-09-04 MED ORDER — LIDOCAINE HCL (PF) 1 % IJ SOLN
INTRAMUSCULAR | Status: AC
  Filled 2017-09-04: qty 5

## 2017-09-04 MED ORDER — VANCOMYCIN HCL 10 G IV SOLR
1500.0000 mg | Freq: Once | INTRAVENOUS | Status: AC
  Administered 2017-09-04: 1500 mg via INTRAVENOUS
  Filled 2017-09-04: qty 1500

## 2017-09-04 MED ORDER — VANCOMYCIN HCL 1000 MG IV SOLR
INTRAVENOUS | Status: AC
  Filled 2017-09-04: qty 1000

## 2017-09-04 MED ORDER — SODIUM CHLORIDE 0.9 % IV SOLN
1.0000 g | INTRAVENOUS | Status: DC
  Administered 2017-09-05 – 2017-09-07 (×3): 1 g via INTRAVENOUS
  Filled 2017-09-04 (×2): qty 1
  Filled 2017-09-04: qty 10

## 2017-09-04 MED ORDER — SODIUM CHLORIDE 0.9 % IV SOLN
100.0000 mg | Freq: Two times a day (BID) | INTRAVENOUS | Status: DC
  Administered 2017-09-05 – 2017-09-07 (×5): 100 mg via INTRAVENOUS
  Filled 2017-09-04 (×5): qty 100

## 2017-09-04 MED ORDER — LACTATED RINGERS IV BOLUS
1000.0000 mL | Freq: Once | INTRAVENOUS | Status: AC
  Administered 2017-09-04: 1000 mL via INTRAVENOUS

## 2017-09-04 MED ORDER — LIDOCAINE HCL (PF) 1 % IJ SOLN
5.0000 mL | Freq: Once | INTRAMUSCULAR | Status: AC
  Administered 2017-09-04: 5 mL via INTRADERMAL
  Filled 2017-09-04: qty 5

## 2017-09-04 MED ORDER — DOXYCYCLINE HYCLATE 100 MG IV SOLR
INTRAVENOUS | Status: AC
  Filled 2017-09-04: qty 200

## 2017-09-04 MED ORDER — ACETAMINOPHEN 325 MG PO TABS
650.0000 mg | ORAL_TABLET | Freq: Four times a day (QID) | ORAL | Status: DC | PRN
  Administered 2017-09-04 – 2017-09-08 (×7): 650 mg via ORAL
  Filled 2017-09-04 (×7): qty 2

## 2017-09-04 MED ORDER — ACETAMINOPHEN 650 MG RE SUPP: 650.0000 mg | Freq: Four times a day (QID) | RECTAL | Status: DC | PRN

## 2017-09-04 MED ORDER — SODIUM CHLORIDE 0.9 % IV SOLN
2.0000 g | Freq: Once | INTRAVENOUS | Status: AC
  Administered 2017-09-04: 2 g via INTRAVENOUS
  Filled 2017-09-04: qty 20

## 2017-09-04 MED ORDER — ACETAMINOPHEN 325 MG PO TABS
650.0000 mg | ORAL_TABLET | Freq: Once | ORAL | Status: AC
  Administered 2017-09-04: 650 mg via ORAL
  Filled 2017-09-04: qty 2

## 2017-09-04 MED ORDER — VANCOMYCIN HCL 500 MG IV SOLR
INTRAVENOUS | Status: AC
  Filled 2017-09-04: qty 500

## 2017-09-04 MED ORDER — DOXYCYCLINE HYCLATE 100 MG IV SOLR
200.0000 mg | Freq: Once | INTRAVENOUS | Status: AC
  Administered 2017-09-04: 200 mg via INTRAVENOUS
  Filled 2017-09-04: qty 200

## 2017-09-04 MED ORDER — IOPAMIDOL (ISOVUE-370) INJECTION 76%
100.0000 mL | Freq: Once | INTRAVENOUS | Status: AC | PRN
  Administered 2017-09-04: 100 mL via INTRAVENOUS

## 2017-09-04 NOTE — ED Provider Notes (Signed)
Patient's lumbar puncture without evidence of meningitis.  No white blood cells in the CSF.  But patient with persistent fevers not feeling any better throughout all of today.  There was improvement in his lactic acid where he came down to the low twos.  Still having temps in the 103 range.  No distinct history of tick exposure but had been outside a lot about 2 weeks ago so it is a possibility [facility] spotted fever titer sent doxycycline antibiotic ordered on top of the Rocephin vancomycin he is already received.  Patient is never been hypotensive but certainly does meet sepsis criteria.  No upper respiratory or GI symptoms at all.  Urinalysis a little questionable abnormal possible source.  Discussed with Dr. Maudie Mercury hospitalist at [facility] he will admit to the internal medicine service telemetry there.  CRITICAL CARE Performed by: Fredia Sorrow Total critical care time: 30 minutes Critical care time was exclusive of separately billable procedures and treating other patients. Critical care was necessary to treat or prevent imminent or life-threatening deterioration. Critical care was time spent personally by me on the following activities: development of treatment plan with patient and/or surrogate as well as nursing, discussions with consultants, evaluation of patient's response to treatment, examination of patient, obtaining history from patient or surrogate, ordering and performing treatments and interventions, ordering and review of laboratory studies, ordering and review of radiographic studies, pulse oximetry and re-evaluation of patient's condition.    Fredia Sorrow, MD 09/04/17 [phone]

## 2017-09-04 NOTE — Progress Notes (Signed)
37 yo male with fever. Pt noted being outdoors quite a bit, no h/o known tick bite. Pt has some frequency of urination.   Wbc 23.2 with toxic granulations. CXR no acute process. LP negative.   Urinalysis wbc 6-10,  Pt will be admitted for sepsis secondary to UTI ?, r/o tick born illness

## 2017-09-04 NOTE — ED Notes (Signed)
Date and time results received: 09/04/17 1845 (use smartphrase ".now" to insert current time)  Test: Lactic acid Critical Value: 2.21  Name of Provider Notified: Dr. Rogene Houston  Orders Received? Or Actions Taken?:

## 2017-09-04 NOTE — ED Notes (Signed)
ED Provider at bedside. 

## 2017-09-04 NOTE — ED Provider Notes (Signed)
[facility] Provider Note   CSN: [id number] Arrival date & time: 09/04/17  1147     History   Chief Complaint Chief Complaint  Patient presents with  . Fever    HPI Daniel Mcclure. is a 37 y.o. male.  37 year old male who presents with fever, body aches, and headache.  Yesterday the patient began having fevers and chills associated with diffuse body aches and headache.  His symptoms persisted through today and he presented to PCP who sent him here for further evaluation. No vision changes, extremity weakness, or neck stiffness. He has had nausea but no vomiting or diarrhea.  No cough or sore throat.  He notes some mild urgency after he urinates but no burning with urination or hematuria.  No abdominal pain.  He has noticed occasional sharp, left-sided chest pain that may sometimes be worse with inspiration but comes and goes randomly.  Nonexertional.  He denies any associated shortness of breath.  He and wife recently traveled back from [place].  He has had several weeks of intermittent pain in his left foot at the site of previous surgery and pinning.  No skin changes or swelling of his foot.  No rashes.  He denies any tick bites or outdoor exposure.  Of note, a few weeks ago he had a 1 week diarrheal illness during which time he had 6-7 liquid stools per day.  His symptoms have since resolved.  The history is provided by the patient.  Fever      Past Medical History:  Diagnosis Date  . Hallux valgus due to metatarsus primus varus 12/2014   right    Patient Active Problem List   Diagnosis Date Noted  . Visit for preventive health examination 10/14/2014    Past Surgical History:  Procedure Laterality Date  . BUNIONECTOMY Left   . KNEE SURGERY Left 1992  . METATARSAL OSTEOTOMY WITH BUNIONECTOMY Right 01/01/2015   Procedure: FIRST METATARSAL SCARF OSTEOTOMY, MODIFIED MCBRIDE BUNIONECTOMY;  Surgeon: Wylene Simmer, MD;  Location: [facility];  Service: Orthopedics;  Laterality: Right;        Home Medications    Prior to Admission medications   Medication Sig Start Date End Date Taking? Authorizing Provider  Multiple Vitamin (MULTIVITAMIN) tablet Take 1 tablet by mouth daily.   Yes [provider]    Family History Family History  Problem Relation Age of Onset  . Hypertension Father   . Stroke Paternal Grandfather   . Cancer Neg Hx     Social History Social History   Tobacco Use  . Smoking status: Never Smoker  . Smokeless tobacco: Never Used  Substance Use Topics  . Alcohol use: No  . Drug use: No     Allergies   Patient has no known allergies.   Review of Systems Review of Systems  Constitutional: Positive for fever.   All other systems reviewed and are negative except that which was mentioned in HPI   Physical Exam Updated Vital Signs BP 128/78 (BP Location: Right Arm)   Pulse 77   Temp (!) 102.9 F (39.4 C) (Oral)   Resp 18   Ht 5' 10"  (1.778 m)   Wt 74.4 kg (164 lb)   SpO2 100%   BMI 23.53 kg/m   Physical Exam  Constitutional: He is oriented to person, place, and time. He appears well-developed and well-nourished. No distress.  HENT:  Head: Normocephalic and atraumatic.  Mouth/Throat: Oropharynx is clear and  moist.  Moist mucous membranes  Eyes: Pupils are equal, round, and reactive to light. Conjunctivae are normal.  Neck: Normal range of motion. Neck supple.  No meningismus  Cardiovascular: Normal rate, regular rhythm and normal heart sounds.  No murmur heard. Pulmonary/Chest: Effort normal and breath sounds normal.  Abdominal: Soft. Bowel sounds are normal. He exhibits no distension. There is no tenderness.  Musculoskeletal: He exhibits no edema or tenderness.  Scars on b/l 1st metatarsals and 1st MTP joints with no focal swelling or warmth  Neurological: He is alert and oriented to person, place, and time.  Fluent speech  Skin: Skin is warm and  dry. No rash noted.  Psychiatric: He has a normal mood and affect. Judgment normal.  Nursing note and vitals reviewed.    ED Treatments / Results  Labs (all labs ordered are listed, but only abnormal results are displayed) Labs Reviewed  COMPREHENSIVE METABOLIC PANEL - Abnormal; Notable for the following components:      Result Value   Glucose, Bld 124 (*)    All other components within normal limits  CBC WITH DIFFERENTIAL/PLATELET - Abnormal; Notable for the following components:   WBC 23.2 (*)    Neutro Abs 19.5 (*)    Monocytes Absolute 2.3 (*)    All other components within normal limits  URINALYSIS, ROUTINE W REFLEX MICROSCOPIC - Abnormal; Notable for the following components:   APPearance CLOUDY (*)    Ketones, ur 15 (*)    Protein, ur 30 (*)    All other components within normal limits  URINALYSIS, MICROSCOPIC (REFLEX) - Abnormal; Notable for the following components:   Bacteria, UA MANY (*)    All other components within normal limits  I-STAT CG4 LACTIC ACID, ED - Abnormal; Notable for the following components:   Lactic Acid, Venous 2.85 (*)    All other components within normal limits  I-STAT CG4 LACTIC ACID, ED - Abnormal; Notable for the following components:   Lactic Acid, Venous 3.08 (*)    All other components within normal limits  CULTURE, BLOOD (ROUTINE X 2)  CULTURE, BLOOD (ROUTINE X 2)  URINE CULTURE  CSF CULTURE  GRAM STAIN  TROPONIN I  CSF CELL COUNT WITH DIFFERENTIAL  CSF CELL COUNT WITH DIFFERENTIAL  PROTEIN AND GLUCOSE, CSF    EKG EKG Interpretation  Date/Time:  Monday September 04 2017 14:49:47 EDT Ventricular Rate:  77 PR Interval:    QRS Duration: 84 QT Interval:  338 QTC Calculation: 383 R Axis:   -16 Text Interpretation:  Atrial fibrillation Borderline left axis deviation ST elev, probable normal early repol pattern Sinus rhythm with artifact due to shaking  Confirmed by Theotis Burrow [phone]) on 09/04/2017 4:01:35 PM   Radiology Dg  Chest 2 View  Result Date: 09/04/2017 CLINICAL DATA:  Cough, fever, and weakness for 2 days. EXAM: CHEST - 2 VIEW COMPARISON:  None. FINDINGS: The heart size and mediastinal contours are within normal limits. Both lungs are clear. Scarring is seen in the right upper lobe. An asymmetric ill-defined nodular density is seen in the left upper lung on the frontal projection. No evidence of pulmonary infiltrate or edema. No evidence of pleural effusion. IMPRESSION: Ill-defined nodular density in left upper lung. Recommend chest CT without contrast for further evaluation. Right upper lobe scarring. Electronically Signed   By: Earle Gell M.D.   On: 09/04/2017 12:09   Ct Head Wo Contrast  Result Date: 09/04/2017 CLINICAL DATA:  Two days of headache, fever, chills, weakness, and  fatigue. EXAM: CT HEAD WITHOUT CONTRAST TECHNIQUE: Contiguous axial images were obtained from the base of the skull through the vertex without intravenous contrast. COMPARISON:  None. FINDINGS: Brain: There is no evidence of acute infarct, intracranial hemorrhage, mass, midline shift, or extra-axial fluid collection. The ventricles and sulci are normal. Vascular: No hyperdense vessel. Skull: No fracture or focal osseous lesion. Sinuses/Orbits: Visualized paranasal sinuses and mastoid air cells are clear. Visualized orbits are unremarkable. Other: None. IMPRESSION: Negative head CT. Electronically Signed   By: Logan Bores M.D.   On: 09/04/2017 16:05   Ct Angio Chest Pe W/cm &/or Wo Cm  Result Date: 09/04/2017 CLINICAL DATA:  Nausea, fever, chills weakness and fatigue. Two days of headache. Current chest radiograph demonstrates an ill-defined nodule in the left upper lung. EXAM: CT ANGIOGRAPHY CHEST WITH CONTRAST TECHNIQUE: Multidetector CT imaging of the chest was performed using the standard protocol during bolus administration of intravenous contrast. Multiplanar CT image reconstructions and MIPs were obtained to evaluate the vascular  anatomy. CONTRAST:  145m ISOVUE-370 IOPAMIDOL (ISOVUE-370) INJECTION 76% COMPARISON:  Chest radiograph, 09/04/2017 at 12:27 p.m. FINDINGS: Cardiovascular: Opacification of the pulmonary arteries is slightly less than that of the aorta. This somewhat limits assessment of the segmental and subsegmental vessels for pulmonary emboli. Allowing for this mild limitation, there is no evidence of a pulmonary embolism. Heart is normal in size and configuration. No pericardial effusion. Great vessels normal caliber. No aortic dissection or atherosclerosis. Mediastinum/Nodes: No enlarged mediastinal, hilar, or axillary lymph nodes. Thyroid gland, trachea, and esophagus demonstrate no significant findings. Lungs/Pleura: No pulmonary nodule. The faint opacity noted on the current chest radiograph most likely due to superimposed structures. There are mild peribronchovascular reticular type opacities in the anteromedial upper lobes, consistent with scarring. Lungs are otherwise clear. No pleural effusion or pneumothorax. Upper Abdomen: Unremarkable. Musculoskeletal: No chest wall abnormality. No acute or significant osseous findings. Review of the MIP images confirms the above findings. IMPRESSION: 1. No evidence of a pulmonary embolism. 2. No acute findings. 3. No pulmonary mass or nodule. 4. Mild areas of reticular opacity in the upper lobes consistent with scarring or subsegmental atelectasis. Electronically Signed   By: DLajean ManesM.D.   On: 09/04/2017 15:04   Dg Foot Complete Left  Result Date: 09/04/2017 CLINICAL DATA:  Bunion surgery 2016.  Increasing foot pain EXAM: LEFT FOOT - COMPLETE 3+ VIEW COMPARISON:  None. FINDINGS: Prior bunionectomy. Two screws in the first metatarsal in satisfactory position without loosening. Mild degenerative change first metatarsal phalangeal joint. No acute bony abnormality IMPRESSION: Postsurgical changes first metatarsal.  No acute abnormality. Electronically Signed   By: CFranchot GalloM.D.   On: 09/04/2017 14:46    Procedures .Critical Care Performed by: LSharlett Iles MD Authorized by: LSharlett Iles MD   Critical care provider statement:    Critical care time (minutes):  40   Critical care time was exclusive of:  Separately billable procedures and treating other patients   Critical care was necessary to treat or prevent imminent or life-threatening deterioration of the following conditions:  Sepsis   Critical care was time spent personally by me on the following activities:  Development of treatment plan with patient or surrogate, evaluation of patient's response to treatment, examination of patient, obtaining history from patient or surrogate, ordering and performing treatments and interventions, ordering and review of laboratory studies, ordering and review of radiographic studies and re-evaluation of patient's condition .Lumbar Puncture Date/Time: 09/04/2017 4:38 PM Performed by: LRex Kras  Wenda Overland, MD Authorized by: Sharlett Iles, MD   Consent:    Consent obtained:  Verbal and written   Consent given by:  Patient and spouse   Risks discussed:  Bleeding, headache, nerve damage, infection and pain   Alternatives discussed:  No treatment Pre-procedure details:    Procedure purpose:  Diagnostic   Preparation: Patient was prepped and draped in usual sterile fashion   Sedation:    Sedation type:  Anxiolysis Anesthesia (see MAR for exact dosages):    Anesthesia method:  Local infiltration   Local anesthetic:  Lidocaine 1% w/o epi Procedure details:    Lumbar space:  L3-L4 interspace   Patient position:  Sitting   Needle gauge:  20   Needle type:  Spinal needle - Quincke tip   Needle length (in):  3.5   Number of attempts:  1   Fluid appearance:  Clear   Tubes of fluid:  4   Total volume (ml):  6 Post-procedure:    Puncture site:  Adhesive bandage applied   Patient tolerance of procedure:  Tolerated well, no immediate  complications   (including critical care time)  Medications Ordered in ED Medications  cefTRIAXone (ROCEPHIN) 2 g in sodium chloride 0.9 % 100 mL IVPB (has no administration in time range)  vancomycin (VANCOCIN) 1,500 mg in sodium chloride 0.9 % 500 mL IVPB (1,500 mg Intravenous New Bag/Given 09/04/17 1620)  lidocaine (PF) (XYLOCAINE) 1 % injection (has no administration in time range)  vancomycin (VANCOCIN) 1000 MG powder (has no administration in time range)  vancomycin (VANCOCIN) 500 MG powder (has no administration in time range)  acetaminophen (TYLENOL) tablet 650 mg (650 mg Oral Given 09/04/17 1158)  sodium chloride 0.9 % bolus 1,500 mL (0 mLs Intravenous Stopped 09/04/17 1537)  iopamidol (ISOVUE-370) 76 % injection 100 mL (100 mLs Intravenous Contrast Given 09/04/17 1431)  lactated ringers bolus 1,000 mL (0 mLs Intravenous Stopped 09/04/17 1637)  lidocaine (PF) (XYLOCAINE) 1 % injection 5 mL (5 mLs Intradermal Given 09/04/17 1621)  midazolam (VERSED) injection 2 mg (2 mg Intravenous Given 09/04/17 1614)     Initial Impression / Assessment and Plan / ED Course  I have reviewed the triage vital signs and the nursing notes.  Pertinent labs & imaging results that were available during my care of the patient were reviewed by me and considered in my medical decision making (see chart for details).     Pt was non-toxic on exam, T 103.1, remainder of VS normal. DDx includes viral illness, tick-borne illness, meningitis, PNA, less likely foot hardware infection as no swelling or severe pain of foot on exam.  Elevated lactate at 2.85, WBC 23.2. Normal LFTs. UA with bacteria but nitrite and leukocyte negative. He is not complaining of flank pain or dysuria to suggest pyelo. CXR shows nodular density in LUL, CT recommended. Obtained CTA chest given recent travel and chest pain. CTA negative for PE or infiltrate, previous XR finding was artifact.  Given fever, lactic acidosis and leukocytosis, I  recommended LP to r/o meningitis. After discussion of risks/benefits, pt agreed to LP following head CT to r/o mass. See procedure note for details. Pt tolerated well.    I am signing out to the oncoming provider w/ CSF studies pending. Final Clinical Impressions(s) / ED Diagnoses   Final diagnoses:  None    ED Discharge Orders    None       Maritssa Haughton, Wenda Overland, MD 09/04/17 [phone]

## 2017-09-04 NOTE — ED Triage Notes (Signed)
Fever, body aches, headache since yesterday.

## 2017-09-04 NOTE — Patient Instructions (Addendum)
Your severe weight loss, severe ha, fever, and fatigue requires work up in the ED.  You are likely dehydrate and need work up for cause of fever.  I called downstairs to ED and talked with MD.  They now your are on your way.   We are transporting you by wheelchair.  Follow up with [place] as needed post ED evaluation.

## 2017-09-04 NOTE — H&P (Signed)
TRH H&P   Patient Demographics:    Daniel Mcclure, is a 37 y.o. male  MRN: [id number]   DOB - August 17, 1980  Admit Date - 09/04/2017  Outpatient Primary MD for the patient is Delorse Limber  Referring MD/NP/PA:    Fredia Sorrow   Outpatient Specialists:    Patient coming from: home  Chief Complaint  Patient presents with  . Fever      HPI:    Daniel Mcclure  is a 37 y.o. male, w c/o fever/ chills, since yesterday,  Slight increase in frequency of urination.  Denies n/v, flank pain, dysuria, hematuria.  Pt has c/o headache, but no obvious tick exposure. But ER was concerned about RMSF.   Titers have been sent off.  Pt denies photophobia, neck stiffness, cough, cp, palp, sob, diarrhea, brbpr, black stool.       In ED,  Pt febrile to 103, pt received vanco iv x1, and rocephin 2gm iv x1 and doxycycline iv x1 in the ED.   CT brain IMPRESSION: Negative head CT.  CTA chest IMPRESSION: 1. No evidence of a pulmonary embolism. 2. No acute findings. 3. No pulmonary mass or nodule. 4. Mild areas of reticular opacity in the upper lobes consistent with scarring or subsegmental atelectasis.  Left foot xray IMPRESSION: Postsurgical changes first metatarsal.  No acute abnormality.  Urinalysis wbc 6-10, Rbc 0-5 Na 137, K 3.8, Bun 11, Creatinine 1.09  Wbc 23.2, Hgb 14.8, Plt 344  Trop <0.03  Lactic acid 2.85 Blood culture x2 pending CSF wbc 0,  Gram stain negative  RMSF titer pending  Pt will be admitted for fever, headache, and UTI.      Review of systems:    In addition to the HPI above,  No Fever-chills, No changes with Vision or hearing, No problems swallowing food or Liquids, No Chest pain, Cough or Shortness of Breath, No Abdominal pain, No Nausea or Vommitting, Bowel movements are regular, No Blood in stool or Urine, No dysuria, No new skin  rashes or bruises, No new joints pains-aches,  No new weakness, tingling, numbness in any extremity, No recent weight gain or loss, No polyuria, polydypsia or polyphagia, No significant Mental Stressors.  A full 10 point Review of Systems was done, except as stated above, all other Review of Systems were negative.   With Past History of the following :    Past Medical History:  Diagnosis Date  . Hallux valgus due to metatarsus primus varus 12/2014   right      Past Surgical History:  Procedure Laterality Date  . BUNIONECTOMY Left   . KNEE SURGERY Left 1992  . METATARSAL OSTEOTOMY WITH BUNIONECTOMY Right 01/01/2015   Procedure: FIRST METATARSAL SCARF OSTEOTOMY, MODIFIED MCBRIDE BUNIONECTOMY;  Surgeon: Wylene Simmer, MD;  Location: [facility];  Service: Orthopedics;  Laterality: Right;      Social  History:     Social History   Tobacco Use  . Smoking status: Never Smoker  . Smokeless tobacco: Never Used  Substance Use Topics  . Alcohol use: No     Lives - at home  Mobility - walks by self   Family History :     Family History  Problem Relation Age of Onset  . Hypertension Father   . Stroke Paternal Grandfather   . Cancer Neg Hx        Home Medications:   Prior to Admission medications   Medication Sig Start Date End Date Taking? Authorizing Provider  acetaminophen (TYLENOL) 325 MG tablet Take 650 mg by mouth every 6 (six) hours as needed for mild pain.   Yes [provider]  Ginkgo Biloba Extract 60 MG CAPS Take 60 mg by mouth daily.   Yes [provider]  ibuprofen (ADVIL,MOTRIN) 200 MG tablet Take 400 mg by mouth every 6 (six) hours as needed for mild pain.   Yes [provider]  Multiple Vitamin (MULTIVITAMIN) tablet Take 1 tablet by mouth daily.   Yes [provider]     Allergies:    No Known Allergies   Physical Exam:   Vitals  Blood pressure 119/60, pulse 84, temperature (!) 103.2 F (39.6  C), temperature source Oral, resp. rate 18, height _0  (1.778 m), weight 74.4 kg (164 lb), SpO2 99 %.   1. General  lying in bed in NAD,   2. Normal affect and insight, Not Suicidal or Homicidal, Awake Alert, Oriented X 3.  3. No F.N deficits, ALL C.Nerves Intact, Strength 5/5 all 4 extremities, Sensation intact all 4 extremities, Plantars down going.  4. Ears and Eyes appear Normal, Conjunctivae clear, PERRLA. Moist Oral Mucosa.  5. Supple Neck, No JVD, No cervical lymphadenopathy appriciated, No Carotid Bruits.  6. Symmetrical Chest wall movement, Good air movement bilaterally, CTAB.  7. RRR, No Gallops, Rubs or Murmurs, No Parasternal Heave.  8. Positive Bowel Sounds, Abdomen Soft, No tenderness, No organomegaly appriciated,No rebound -guarding or rigidity.  9.  No Cyanosis, Normal Skin Turgor, No Skin Rash or Bruise.  10. Good muscle tone,  joints appear normal , no effusions, Normal ROM.  11. No Palpable Lymph Nodes in Neck or Axillae  No janeway, no osler, no splinter    Data Review:    CBC Recent Labs  Lab 09/04/17 1243  WBC 23.2*  HGB 14.8  HCT 43.3  PLT 344  MCV 78.3  MCH 26.8  MCHC 34.2  RDW 13.3  LYMPHSABS 1.4  MONOABS 2.3*  EOSABS 0.0  BASOSABS 0.0   ------------------------------------------------------------------------------------------------------------------  Chemistries  Recent Labs  Lab 09/04/17 1243  NA 137  K 3.8  CL 101  CO2 26  GLUCOSE 124*  BUN 11  CREATININE 1.09  CALCIUM 9.1  AST 27  ALT 23  ALKPHOS 49  BILITOT 0.7   ------------------------------------------------------------------------------------------------------------------ estimated creatinine clearance is 95.8 mL/min (by C-G formula based on SCr of 1.09 mg/dL). ------------------------------------------------------------------------------------------------------------------ No results for input(s): TSH, T4TOTAL, T3FREE, THYROIDAB in the last 72  hours.  Invalid input(s): FREET3  Coagulation profile No results for input(s): INR, PROTIME in the last 168 hours. ------------------------------------------------------------------------------------------------------------------- No results for input(s): DDIMER in the last 72 hours. -------------------------------------------------------------------------------------------------------------------  Cardiac Enzymes Recent Labs  Lab 09/04/17 1243  TROPONINI <0.03   ------------------------------------------------------------------------------------------------------------------ No results found for: BNP   ---------------------------------------------------------------------------------------------------------------  Urinalysis    Component Value Date/Time   COLORURINE YELLOW 09/04/2017 1207   APPEARANCEUR  CLOUDY (A) 09/04/2017 1207   LABSPEC 1.020 09/04/2017 1207   PHURINE 6.0 09/04/2017 [place] 09/04/2017 1207   GLUCOSEU NEGATIVE 10/14/2014 1006   HGBUR NEGATIVE 09/04/2017 1207   BILIRUBINUR NEGATIVE 09/04/2017 1207   KETONESUR 15 (A) 09/04/2017 1207   PROTEINUR 30 (A) 09/04/2017 1207   UROBILINOGEN 0.2 10/14/2014 1006   NITRITE NEGATIVE 09/04/2017 1207   LEUKOCYTESUR NEGATIVE 09/04/2017 1207    ----------------------------------------------------------------------------------------------------------------   Imaging Results:    Dg Chest 2 View  Result Date: 09/04/2017 CLINICAL DATA:  Cough, fever, and weakness for 2 days. EXAM: CHEST - 2 VIEW COMPARISON:  None. FINDINGS: The heart size and mediastinal contours are within normal limits. Both lungs are clear. Scarring is seen in the right upper lobe. An asymmetric ill-defined nodular density is seen in the left upper lung on the frontal projection. No evidence of pulmonary infiltrate or edema. No evidence of pleural effusion. IMPRESSION: Ill-defined nodular density in left upper lung. Recommend chest CT  without contrast for further evaluation. Right upper lobe scarring. Electronically Signed   By: Earle Gell M.D.   On: 09/04/2017 12:09   Ct Head Wo Contrast  Result Date: 09/04/2017 CLINICAL DATA:  Two days of headache, fever, chills, weakness, and fatigue. EXAM: CT HEAD WITHOUT CONTRAST TECHNIQUE: Contiguous axial images were obtained from the base of the skull through the vertex without intravenous contrast. COMPARISON:  None. FINDINGS: Brain: There is no evidence of acute infarct, intracranial hemorrhage, mass, midline shift, or extra-axial fluid collection. The ventricles and sulci are normal. Vascular: No hyperdense vessel. Skull: No fracture or focal osseous lesion. Sinuses/Orbits: Visualized paranasal sinuses and mastoid air cells are clear. Visualized orbits are unremarkable. Other: None. IMPRESSION: Negative head CT. Electronically Signed   By: Logan Bores M.D.   On: 09/04/2017 16:05   Ct Angio Chest Pe W/cm &/or Wo Cm  Result Date: 09/04/2017 CLINICAL DATA:  Nausea, fever, chills weakness and fatigue. Two days of headache. Current chest radiograph demonstrates an ill-defined nodule in the left upper lung. EXAM: CT ANGIOGRAPHY CHEST WITH CONTRAST TECHNIQUE: Multidetector CT imaging of the chest was performed using the standard protocol during bolus administration of intravenous contrast. Multiplanar CT image reconstructions and MIPs were obtained to evaluate the vascular anatomy. CONTRAST:  133m ISOVUE-370 IOPAMIDOL (ISOVUE-370) INJECTION 76% COMPARISON:  Chest radiograph, 09/04/2017 at 12:27 p.m. FINDINGS: Cardiovascular: Opacification of the pulmonary arteries is slightly less than that of the aorta. This somewhat limits assessment of the segmental and subsegmental vessels for pulmonary emboli. Allowing for this mild limitation, there is no evidence of a pulmonary embolism. Heart is normal in size and configuration. No pericardial effusion. Great vessels normal caliber. No aortic dissection or  atherosclerosis. Mediastinum/Nodes: No enlarged mediastinal, hilar, or axillary lymph nodes. Thyroid gland, trachea, and esophagus demonstrate no significant findings. Lungs/Pleura: No pulmonary nodule. The faint opacity noted on the current chest radiograph most likely due to superimposed structures. There are mild peribronchovascular reticular type opacities in the anteromedial upper lobes, consistent with scarring. Lungs are otherwise clear. No pleural effusion or pneumothorax. Upper Abdomen: Unremarkable. Musculoskeletal: No chest wall abnormality. No acute or significant osseous findings. Review of the MIP images confirms the above findings. IMPRESSION: 1. No evidence of a pulmonary embolism. 2. No acute findings. 3. No pulmonary mass or nodule. 4. Mild areas of reticular opacity in the upper lobes consistent with scarring or subsegmental atelectasis. Electronically Signed   By: DLajean ManesM.D.   On: 09/04/2017 15:04  Dg Foot Complete Left  Result Date: 09/04/2017 CLINICAL DATA:  Bunion surgery 2016.  Increasing foot pain EXAM: LEFT FOOT - COMPLETE 3+ VIEW COMPARISON:  None. FINDINGS: Prior bunionectomy. Two screws in the first metatarsal in satisfactory position without loosening. Mild degenerative change first metatarsal phalangeal joint. No acute bony abnormality IMPRESSION: Postsurgical changes first metatarsal.  No acute abnormality. Electronically Signed   By: Franchot Gallo M.D.   On: 09/04/2017 14:46       Assessment & Plan:    Active Problems:   Sepsis ([place])    Sepsis (Fever, leukocytosis, elevated lactic acid) UTI Unclear source, probably UTI No recent dental work, no iv drug use Blood culture x2 Urine culture RMSF titer pending Check ESR Check cardiac echo Ns at 85m per hour x 1 day Rocephin 1gm iv qday Doxycycline 107miv bid     DVT Prophylaxis   Lovenox - SCDs   AM Labs Ordered, also please review Full Orders  Family Communication: Admission, patients  condition and plan of care including tests being ordered have been discussed with the patient  who indicate understanding and agree with the plan and Code Status.  Code Status  FULL CODE  Likely DC to  home  Condition GUARDED   Consults called:  none  Admission status:  inpatient  Time spent in minutes : 60   JaJani Gravel.D on 09/04/2017 at 9:29 PM  Between 7am to 7pm - Pager - [phone] After 7pm go to [email or web address] - password [email or web address] - Office  [phone]

## 2017-09-04 NOTE — Progress Notes (Signed)
Subjective:    Patient ID: Daniel Mcclure., male    DOB: 09-23-1980, 37 y.o.   MRN: [id number]  HPI  Pt in with report of fever, fatigue headaches.  He states he feels so tired that he might collapse. Pt admits today even coming into room he almost fell down ambulating  Pt feels sick like this for 2 days.  Pt states he first felt body chills before he got sick 2 days ago.( During the interview he described about 3 times that he feels so tired can't even walk.)  Pt also states severe HA. Lamonte Sakai is level 9/10. Some photophobia but no history of migraines per his report.  Pt neck is not stiff. Pt does feel some nausea.   No sore throat. No sinus pressures. No cough.   Pt states 2  week he lost about 15 pounds. He was in [place] and states GI illness. He states 7-8 loose stools a day. For past week he states stools have formed up.  Pt states he rarely get ha.  Pt state some eating and drinking since back from vacation.  Before he left for vacation weighted about 179-180 lb.   Review of Systems  Constitutional: Positive for fatigue and fever.  HENT: Negative for congestion, facial swelling, postnasal drip, sinus pressure, sneezing and trouble swallowing.   Eyes: Positive for photophobia.       Sever light sensitive today.  Respiratory: Negative for cough, chest tightness, shortness of breath and wheezing.   Cardiovascular: Negative for chest pain and palpitations.  Gastrointestinal: Negative for abdominal pain.  Musculoskeletal: Negative for back pain.  Skin: Negative for rash.  Neurological: Positive for dizziness, light-headedness and headaches. Negative for tremors, speech difficulty and weakness.  Psychiatric/Behavioral: Negative for behavioral problems and confusion.    Past Medical History:  Diagnosis Date  . Hallux valgus due to metatarsus primus varus 12/2014   right     Social History   Socioeconomic History  . Marital status: Married    Spouse name: Not  on file  . Number of children: 1  . Years of education: Not on file  . Highest education level: Not on file  Occupational History    Employer: [employer]  Social Needs  . Financial resource strain: Not on file  . Food insecurity:    Worry: Not on file    Inability: Not on file  . Transportation needs:    Medical: Not on file    Non-medical: Not on file  Tobacco Use  . Smoking status: Never Smoker  . Smokeless tobacco: Never Used  Substance and Sexual Activity  . Alcohol use: No  . Drug use: No  . Sexual activity: Not on file  Lifestyle  . Physical activity:    Days per week: Not on file    Minutes per session: Not on file  . Stress: Not on file  Relationships  . Social connections:    Talks on phone: Not on file    Gets together: Not on file    Attends religious service: Not on file    Active member of club or organization: Not on file    Attends meetings of clubs or organizations: Not on file    Relationship status: Not on file  . Intimate partner violence:    Fear of current or ex partner: Not on file    Emotionally abused: Not on file    Physically abused: Not on file    Forced sexual activity: Not  on file  Other Topics Concern  . Not on file  Social History Narrative   Caffeine use:  1 soda every other day   Regular exercise:  4-5 x weekly   Married, 1 daughter born in 2010   Works at [employer] and [place].    Student at [employer]             Past Surgical History:  Procedure Laterality Date  . BUNIONECTOMY Left   . KNEE SURGERY Left 1992  . METATARSAL OSTEOTOMY WITH BUNIONECTOMY Right 01/01/2015   Procedure: FIRST METATARSAL SCARF OSTEOTOMY, MODIFIED MCBRIDE BUNIONECTOMY;  Surgeon: Wylene Simmer, MD;  Location: [facility];  Service: Orthopedics;  Laterality: Right;    Family History  Problem Relation Age of Onset  . Hypertension Father   . Stroke Paternal Grandfather   . Cancer Neg Hx     No Known Allergies  Current  Outpatient Medications on File Prior to Visit  Medication Sig Dispense Refill  . Multiple Vitamin (MULTIVITAMIN) tablet Take 1 tablet by mouth daily.     No current facility-administered medications on file prior to visit.     BP 112/69   Pulse 85   Temp (!) 101.3 F (38.5 C) (Oral)   Resp 16   Ht 5' 10"  (1.778 m)   Wt 164 lb 9.6 oz (74.7 kg)   SpO2 100%   BMI 23.62 kg/m       Objective:   Physical Exam   General Mental Status- Alert. General Appearance- Appears severe fatigue. Most of exam lays on left side with head covered to decrease ha from light.  Skin General: Color- Normal Color. Moisture- Normal Moisture.  Neck Carotid Arteries- Normal color. Moisture- Normal Moisture. No carotid bruits. No JVD.  Chest and Lung Exam Auscultation: Breath Sounds:-Normal.  Cardiovascular Auscultation:Rythm- Regular. Murmurs & Other Heart Sounds:Auscultation of the heart reveals- No Murmurs.  Abdomen Inspection:-Inspeection Normal. Palpation/Percussion:Note:No mass. Palpation and Percussion of the abdomen reveal- Non Tender, Non Distended + BS, no rebound or guarding.  Neurologic Cranial Nerve exam:- CN III-XII intact(No nystagmus), symmetric smile. Strength:- 5/5 equal and symmetric strength both upper and lower extremities.     Assessment & Plan:  Your severe weight loss, severe ha, fever, and fatigue requires work up in the ED.  You are likely dehydrate and need work up for cause of fever.  I called downstairs to ED and talked with MD.  They now your are on your way.   We are transporting you by wheelchair.  Follow up with [place] as needed post ED evaluation.  Mackie Pai, PA-C

## 2017-09-05 ENCOUNTER — Inpatient Hospital Stay (HOSPITAL_COMMUNITY): Payer: BLUE CROSS/BLUE SHIELD

## 2017-09-05 DIAGNOSIS — A419 Sepsis, unspecified organism: Secondary | ICD-10-CM

## 2017-09-05 DIAGNOSIS — D72829 Elevated white blood cell count, unspecified: Secondary | ICD-10-CM

## 2017-09-05 DIAGNOSIS — R52 Pain, unspecified: Secondary | ICD-10-CM

## 2017-09-05 DIAGNOSIS — R079 Chest pain, unspecified: Secondary | ICD-10-CM

## 2017-09-05 LAB — COMPREHENSIVE METABOLIC PANEL
ALBUMIN: 3.3 g/dL — AB (ref 3.5–5.0)
ALK PHOS: 41 U/L (ref 38–126)
ALT: 17 U/L (ref 0–44)
AST: 17 U/L (ref 15–41)
Anion gap: 8 (ref 5–15)
BUN: 11 mg/dL (ref 6–20)
CALCIUM: 8.2 mg/dL — AB (ref 8.9–10.3)
CO2: 25 mmol/L (ref 22–32)
CREATININE: 1.17 mg/dL (ref 0.61–1.24)
Chloride: 104 mmol/L (ref 98–111)
GFR calc non Af Amer: >60 mL/min (ref >60)
GLUCOSE: 136 mg/dL — AB (ref 70–99)
Potassium: 3 mmol/L — ABNORMAL LOW (ref 3.5–5.1)
SODIUM: 137 mmol/L (ref 135–145)
Total Bilirubin: 0.9 mg/dL (ref 0.3–1.2)
Total Protein: 6.6 g/dL (ref 6.5–8.1)

## 2017-09-05 LAB — HIV ANTIBODY (ROUTINE TESTING W REFLEX): HIV SCREEN 4TH GENERATION: NONREACTIVE

## 2017-09-05 LAB — RAPID URINE DRUG SCREEN, HOSP PERFORMED
Amphetamines: NOT DETECTED
Benzodiazepines: POSITIVE — AB
Cocaine: NOT DETECTED
Opiates: NOT DETECTED
TETRAHYDROCANNABINOL: NOT DETECTED

## 2017-09-05 LAB — CBC
HCT: 39.2 % (ref 39.0–52.0)
Hemoglobin: 13 g/dL (ref 13.0–17.0)
MCH: 26.5 pg (ref 26.0–34.0)
MCHC: 33.2 g/dL (ref 30.0–36.0)
MCV: 79.8 fL (ref 78.0–100.0)
PLATELETS: 293 10*3/uL (ref 150–400)
RBC: 4.91 MIL/uL (ref 4.22–5.81)
RDW: 13.5 % (ref 11.5–15.5)
WBC: 26.5 10*3/uL — ABNORMAL HIGH (ref 4.0–10.5)

## 2017-09-05 LAB — ECHOCARDIOGRAM COMPLETE
Height: 70 in
Weight: 2664 oz

## 2017-09-05 LAB — URINE CULTURE: CULTURE: NO GROWTH

## 2017-09-05 LAB — SEDIMENTATION RATE: Sed Rate: 8 mm/hr (ref 0–16)

## 2017-09-05 MED ORDER — SODIUM CHLORIDE 0.9 % IV BOLUS
500.0000 mL | Freq: Once | INTRAVENOUS | Status: AC
  Administered 2017-09-05: 500 mL via INTRAVENOUS

## 2017-09-05 MED ORDER — POTASSIUM CHLORIDE CRYS ER 20 MEQ PO TBCR
40.0000 meq | EXTENDED_RELEASE_TABLET | Freq: Two times a day (BID) | ORAL | Status: AC
  Administered 2017-09-05 (×2): 40 meq via ORAL
  Filled 2017-09-05 (×2): qty 2

## 2017-09-05 MED ORDER — SODIUM CHLORIDE 0.9 % IV BOLUS
1000.0000 mL | Freq: Once | INTRAVENOUS | Status: AC
  Administered 2017-09-05: 1000 mL via INTRAVENOUS

## 2017-09-05 MED ORDER — IBUPROFEN 200 MG PO TABS
400.0000 mg | ORAL_TABLET | Freq: Four times a day (QID) | ORAL | Status: DC | PRN
  Administered 2017-09-05 – 2017-09-07 (×4): 400 mg via ORAL
  Filled 2017-09-05 (×4): qty 2

## 2017-09-05 NOTE — Progress Notes (Signed)
Pt states to his friend that he thinks he was bitten by a black spider last week. States that he told MD when admitted but has not been mentioned since. Wanted the nurse to put in note about it for future reference.

## 2017-09-05 NOTE — Progress Notes (Signed)
[facility] PROGRESS NOTE    Progress Note  Daniel Mcclure.  FVC:[id number] DOB: March 08, 1980 DOA: 09/04/2017 PCP: Brunetta Jeans, PA-C     Brief Narrative:   Daniel Wilmes. is an 37 y.o. male complaining of fever chills since yesterday and urgency.  In the ED found to be febrile, CT scan of the head negative  Assessment/Plan:   Principal Problem:   Sepsis ([place]) Active Problems:   Leukocytosis  Of unclear source, has a persistent leukocytosis, no nuchal rigidity some mild headache and photophobia, lumbar puncture shows normal white blood cells mildly low protein and 0 white blood cell count and CSF, he denies any cough or shortness of breath CT Angie of the chest shows no infiltrates no PE, left foot x-ray shows no signs of osteomyelitis on physical examination he does not have any erythema warmness or tenderness to touch. Culture data has been ordered, UA shows 6-10 white blood cells with many bacteria urine cultures are pending. HIV and hepatitis panel is pending, he was started empirically on doxycycline titers are pending for [facility] spotted fevers. Blood cultures have been ordered and pending, continue IV vancomycin and Zosyn. For his lightheadedness upon standing start him on aggressive IV fluid hydration recheck in the morning.  DVT prophylaxis: lovenox Family Communication:none Disposition Plan/Barrier to D/C: unable to determine Code Status:     Code Status Orders  (From admission, onward)        Start     Ordered   09/04/17 2148  Full code  Continuous     09/04/17 2150    Code Status History    This patient has a current code status but no historical code status.        IV Access:    Peripheral IV   Procedures and diagnostic studies:   Dg Chest 2 View  Result Date: 09/04/2017 CLINICAL DATA:  Cough, fever, and weakness for 2 days. EXAM: CHEST - 2 VIEW COMPARISON:  None. FINDINGS: The heart size and mediastinal contours are  within normal limits. Both lungs are clear. Scarring is seen in the right upper lobe. An asymmetric ill-defined nodular density is seen in the left upper lung on the frontal projection. No evidence of pulmonary infiltrate or edema. No evidence of pleural effusion. IMPRESSION: Ill-defined nodular density in left upper lung. Recommend chest CT without contrast for further evaluation. Right upper lobe scarring. Electronically Signed   By: Earle Gell M.D.   On: 09/04/2017 12:09   Ct Head Wo Contrast  Result Date: 09/04/2017 CLINICAL DATA:  Two days of headache, fever, chills, weakness, and fatigue. EXAM: CT HEAD WITHOUT CONTRAST TECHNIQUE: Contiguous axial images were obtained from the base of the skull through the vertex without intravenous contrast. COMPARISON:  None. FINDINGS: Brain: There is no evidence of acute infarct, intracranial hemorrhage, mass, midline shift, or extra-axial fluid collection. The ventricles and sulci are normal. Vascular: No hyperdense vessel. Skull: No fracture or focal osseous lesion. Sinuses/Orbits: Visualized paranasal sinuses and mastoid air cells are clear. Visualized orbits are unremarkable. Other: None. IMPRESSION: Negative head CT. Electronically Signed   By: Logan Bores M.D.   On: 09/04/2017 16:05   Ct Angio Chest Pe W/cm &/or Wo Cm  Result Date: 09/04/2017 CLINICAL DATA:  Nausea, fever, chills weakness and fatigue. Two days of headache. Current chest radiograph demonstrates an ill-defined nodule in the left upper lung. EXAM: CT ANGIOGRAPHY CHEST WITH CONTRAST TECHNIQUE: Multidetector CT imaging of the chest was performed using  the standard protocol during bolus administration of intravenous contrast. Multiplanar CT image reconstructions and MIPs were obtained to evaluate the vascular anatomy. CONTRAST:  134m ISOVUE-370 IOPAMIDOL (ISOVUE-370) INJECTION 76% COMPARISON:  Chest radiograph, 09/04/2017 at 12:27 p.m. FINDINGS: Cardiovascular: Opacification of the pulmonary  arteries is slightly less than that of the aorta. This somewhat limits assessment of the segmental and subsegmental vessels for pulmonary emboli. Allowing for this mild limitation, there is no evidence of a pulmonary embolism. Heart is normal in size and configuration. No pericardial effusion. Great vessels normal caliber. No aortic dissection or atherosclerosis. Mediastinum/Nodes: No enlarged mediastinal, hilar, or axillary lymph nodes. Thyroid gland, trachea, and esophagus demonstrate no significant findings. Lungs/Pleura: No pulmonary nodule. The faint opacity noted on the current chest radiograph most likely due to superimposed structures. There are mild peribronchovascular reticular type opacities in the anteromedial upper lobes, consistent with scarring. Lungs are otherwise clear. No pleural effusion or pneumothorax. Upper Abdomen: Unremarkable. Musculoskeletal: No chest wall abnormality. No acute or significant osseous findings. Review of the MIP images confirms the above findings. IMPRESSION: 1. No evidence of a pulmonary embolism. 2. No acute findings. 3. No pulmonary mass or nodule. 4. Mild areas of reticular opacity in the upper lobes consistent with scarring or subsegmental atelectasis. Electronically Signed   By: DLajean ManesM.D.   On: 09/04/2017 15:04   Dg Foot Complete Left  Result Date: 09/04/2017 CLINICAL DATA:  Bunion surgery 2016.  Increasing foot pain EXAM: LEFT FOOT - COMPLETE 3+ VIEW COMPARISON:  None. FINDINGS: Prior bunionectomy. Two screws in the first metatarsal in satisfactory position without loosening. Mild degenerative change first metatarsal phalangeal joint. No acute bony abnormality IMPRESSION: Postsurgical changes first metatarsal.  No acute abnormality. Electronically Signed   By: CFranchot GalloM.D.   On: 09/04/2017 14:46     Medical Consultants:    None.  Anti-Infectives:   IV vancomycin and Zosyn  Subjective:    Daniel Mcclure he relates he continues  to have a persistent headache and fevers.  Lightheaded upon standing  Objective:    Vitals:   09/04/17 1740 09/04/17 1841 09/04/17 2105 09/05/17 0522  BP: 126/67 107/60 119/60 (!) 114/58  Pulse: 92 95 84 91  Resp: 18 20 18 20   Temp: (!) 103.8 F (39.9 C) (!) 103.1 F (39.5 C) (!) 103.2 F (39.6 C) (!) 103 F (39.4 C)  TempSrc: Oral Oral Oral   SpO2: 96% 98% 99% 99%  Weight:    75.5 kg (166 lb 8 oz)  Height:        Intake/Output Summary (Last 24 hours) at 09/05/2017 0950 Last data filed at 09/05/2017 0844 Gross per 24 hour  Intake 2828.33 ml  Output 650 ml  Net 2178.33 ml   Filed Weights   09/04/17 1152 09/04/17 1154 09/05/17 0522  Weight: 74.4 kg (164 lb) 74.4 kg (164 lb) 75.5 kg (166 lb 8 oz)    Exam: General exam: In no acute distress. Respiratory system: Good air movement and clear to auscultation. Cardiovascular system: S1 & S2 heard, RRR. No JVD, murmurs. Gastrointestinal system: Abdomen is nondistended, soft and nontender.  Central nervous system: Alert and oriented. No focal neurological deficits. Extremities: No pedal edema. Skin: No rashes, lesions or ulcers Psychiatry: Judgement and insight appear normal. Mood & affect appropriate.    Data Reviewed:    Labs: Basic Metabolic Panel: Recent Labs  Lab 09/04/17 1243 09/05/17 0637  NA 137 137  K 3.8 3.0*  CL 101 104  CO2 26 25  GLUCOSE 124* 136*  BUN 11 11  CREATININE 1.09 1.17  CALCIUM 9.1 8.2*   GFR Estimated Creatinine Clearance: 89.3 mL/min (by C-G formula based on SCr of 1.17 mg/dL). Liver Function Tests: Recent Labs  Lab 09/04/17 1243 09/05/17 0637  AST 27 17  ALT 23 17  ALKPHOS 49 41  BILITOT 0.7 0.9  PROT 7.9 6.6  ALBUMIN 4.3 3.3*   No results for input(s): LIPASE, AMYLASE in the last 168 hours. No results for input(s): AMMONIA in the last 168 hours. Coagulation profile No results for input(s): INR, PROTIME in the last 168 hours.  CBC: Recent Labs  Lab 09/04/17 1243  09/05/17 0637  WBC 23.2* 26.5*  NEUTROABS 19.5*  --   HGB 14.8 13.0  HCT 43.3 39.2  MCV 78.3 79.8  PLT 344 293   Cardiac Enzymes: Recent Labs  Lab 09/04/17 1243  TROPONINI <0.03   BNP (last 3 results) No results for input(s): PROBNP in the last 8760 hours. CBG: No results for input(s): GLUCAP in the last 168 hours. D-Dimer: No results for input(s): DDIMER in the last 72 hours. Hgb A1c: No results for input(s): HGBA1C in the last 72 hours. Lipid Profile: No results for input(s): CHOL, HDL, LDLCALC, TRIG, CHOLHDL, LDLDIRECT in the last 72 hours. Thyroid function studies: No results for input(s): TSH, T4TOTAL, T3FREE, THYROIDAB in the last 72 hours.  Invalid input(s): FREET3 Anemia work up: No results for input(s): VITAMINB12, FOLATE, FERRITIN, TIBC, IRON, RETICCTPCT in the last 72 hours. Sepsis Labs: Recent Labs  Lab 09/04/17 1243 09/04/17 1254 09/04/17 1506 09/04/17 1842 09/05/17 0637  WBC 23.2*  --   --   --  26.5*  LATICACIDVEN  --  2.85* 3.08* 2.21*  --    Microbiology Recent Results (from the past 240 hour(s))  CSF culture     Status: None (Preliminary result)   Collection Time: 09/04/17  4:38 PM  Result Value Ref Range Status   Specimen Description CSF  Final   Special Requests   Final    Normal Performed at [facility], [address] [place], [place] [address]    Gram Stain   Final    WBC PRESENT, PREDOMINANTLY MONONUCLEAR NO ORGANISMS SEEN CYTOSPIN SMEAR Performed at [facility], [place] [address] [place], [place] [address]    Culture PENDING  Incomplete   Report Status PENDING  Incomplete     Medications:   . enoxaparin (LOVENOX) injection  40 mg Subcutaneous Q24H  . potassium chloride  40 mEq Oral BID   Continuous Infusions: . sodium chloride 75 mL/hr at 09/04/17 2237  . cefTRIAXone (ROCEPHIN)  IV    . doxycycline (VIBRAMYCIN) IV 100 mg (09/05/17 0600)  . sodium chloride    . sodium chloride       LOS: 1 day    Charlynne Cousins  [facility] Pager [phone]  *Please refer to [email or web address], password [email or web address] to get updated schedule on who will round on this patient, as hospitalists switch teams weekly. If 7PM-7AM, please contact night-coverage at [email or web address], password [email or web address] for any overnight needs.  09/05/2017, 9:50 AM

## 2017-09-05 NOTE — Progress Notes (Signed)
  Echocardiogram 2D Echocardiogram has been performed.  Darlina Sicilian M 09/05/2017, 11:48 AM

## 2017-09-05 NOTE — Progress Notes (Signed)
Nutrition Brief Note  Patient identified on the Malnutrition Screening Tool (MST) Report  Wt Readings from Last 15 Encounters:  09/05/17 166 lb 8 oz (75.5 kg)  09/04/17 164 lb 9.6 oz (74.7 kg)  03/30/17 172 lb 3.2 oz (78.1 kg)  01/01/15 176 lb (79.8 kg)  10/14/14 177 lb (80.3 kg)  04/04/14 175 lb 2 oz (79.4 kg)  04/05/11 164 lb (74.4 kg)  10/12/06 155 lb (70.3 kg)   Patient without significant PMH. Presents this admission with complaints of fever and chills. Found to be septic from unclear source, likely UTI. Pt denies having a loss in appetite PTA. States he typically consumes 3 meals each day without complication. Over the last week he experienced diarrhea but this has since resolved. Pt denies any recent wt loss. Does not wish to have supplementation.   Body mass index is 23.89 kg/m. Patient meets criteria for normal based on current BMI.   Current diet order is regular, patient is consuming approximately 50% of meals at this time. Labs and medications reviewed.   No nutrition interventions warranted at this time. If nutrition issues arise, please consult RD.   Mariana Single RD, LDN Clinical Nutrition Pager # [phone]

## 2017-09-06 ENCOUNTER — Inpatient Hospital Stay (HOSPITAL_COMMUNITY): Payer: BLUE CROSS/BLUE SHIELD

## 2017-09-06 DIAGNOSIS — R509 Fever, unspecified: Secondary | ICD-10-CM

## 2017-09-06 DIAGNOSIS — R3911 Hesitancy of micturition: Secondary | ICD-10-CM

## 2017-09-06 DIAGNOSIS — I951 Orthostatic hypotension: Secondary | ICD-10-CM

## 2017-09-06 DIAGNOSIS — R51 Headache: Secondary | ICD-10-CM

## 2017-09-06 DIAGNOSIS — Z9889 Other specified postprocedural states: Secondary | ICD-10-CM

## 2017-09-06 DIAGNOSIS — R5383 Other fatigue: Secondary | ICD-10-CM

## 2017-09-06 DIAGNOSIS — R079 Chest pain, unspecified: Secondary | ICD-10-CM

## 2017-09-06 DIAGNOSIS — R3914 Feeling of incomplete bladder emptying: Secondary | ICD-10-CM

## 2017-09-06 DIAGNOSIS — R300 Dysuria: Secondary | ICD-10-CM

## 2017-09-06 DIAGNOSIS — R5381 Other malaise: Secondary | ICD-10-CM

## 2017-09-06 DIAGNOSIS — R091 Pleurisy: Secondary | ICD-10-CM

## 2017-09-06 LAB — HEPATITIS PANEL, ACUTE
Hep A IgM: NEGATIVE
Hep B C IgM: NEGATIVE
Hepatitis B Surface Ag: NEGATIVE

## 2017-09-06 LAB — CBC WITH DIFFERENTIAL/PLATELET
BASOS PCT: 0 %
Basophils Absolute: 0 10*3/uL (ref 0.0–0.1)
EOS PCT: 1 %
Eosinophils Absolute: 0.2 10*3/uL (ref 0.0–0.7)
HEMATOCRIT: 40.6 % (ref 39.0–52.0)
HEMOGLOBIN: 13.4 g/dL (ref 13.0–17.0)
Lymphocytes Relative: 12 %
Lymphs Abs: 2.2 10*3/uL (ref 0.7–4.0)
MCH: 26.5 pg (ref 26.0–34.0)
MCHC: 33 g/dL (ref 30.0–36.0)
MCV: 80.2 fL (ref 78.0–100.0)
MONO ABS: 1.1 10*3/uL — AB (ref 0.1–1.0)
Monocytes Relative: 6 %
NEUTROS ABS: 15.1 10*3/uL — AB (ref 1.7–7.7)
NEUTROS PCT: 81 %
Platelets: 281 10*3/uL (ref 150–400)
RBC: 5.06 MIL/uL (ref 4.22–5.81)
RDW: 13.8 % (ref 11.5–15.5)
WBC: 18.6 10*3/uL — ABNORMAL HIGH (ref 4.0–10.5)

## 2017-09-06 MED ORDER — OXYCODONE HCL 5 MG PO TABS
5.0000 mg | ORAL_TABLET | Freq: Once | ORAL | Status: AC
  Administered 2017-09-06: 5 mg via ORAL
  Filled 2017-09-06: qty 1

## 2017-09-06 MED ORDER — POTASSIUM CHLORIDE CRYS ER 20 MEQ PO TBCR
40.0000 meq | EXTENDED_RELEASE_TABLET | Freq: Two times a day (BID) | ORAL | Status: AC
  Administered 2017-09-06 (×2): 40 meq via ORAL
  Filled 2017-09-06 (×2): qty 2

## 2017-09-06 NOTE — Consult Note (Signed)
[facility]ious Disease    Date of Admission:  09/04/2017   Total days of antibiotics: 2 vanco/ceftriaxone/doxy               Reason for Consult: FUO    Referring Provider: Olevia Bowens   Assessment: FUO  Plan: 1. Continue doxy/ceftriaxone for now 2. Check HIV RNA 3. Check urine gc/chalmydia 4. Check Ehrlichia Ab 5. Check renal u/s  Comment- Spoke with with and pt at length- his labs are (-) meningitis. He does not have pneumonia, his exam does not support infection in his feet.  He could have RMSF or Ehrlichia infection. RMSF rash is less common/harder to detect in dark skinned persons. His deferevesence with doxy would support either of these dx. His normal LFTs do not.  Acute HIV seems unlikely but worth checking.  His wife's concern is over his urinary hesitancy and feeling of incomplete voiding. His labs don't support UTI or stone.  He denies exposure to unvaccinated children, religious groups.  Dengue can be seen in [place] however would expect him to have abn LFTs.  As long as his WBC continues to go down, would be ok with changing to po soon.    Thank you so much for this interesting consult,  Principal Problem:   SIRS (systemic inflammatory response syndrome) (HCC) Active Problems:   Leukocytosis   Orthostatic hypotension   . enoxaparin (LOVENOX) injection  40 mg Subcutaneous Q24H  . potassium chloride  40 mEq Oral BID    HPI: Melville Engen. is a 37 y.o. male with prev hx of pins in his L & R foot 2016 after bunion surgery, comes to ED on 7-15 with f/c, myalgias and headache beginning 7-14. He denied neck stiffness. He also had lightheadedness-dizziness, and pain with light in his eyes. Has had sporadic pleurisy in L chest during this period.  His travel hx is notable for travel to [facility] July 4- 10. He had GI illness while there.   In ED he was found to have temp 103, WBC 23.2 (84% N), lactate 3.08,  He underwent LP which showed 0  WBC. CTA was (-). CT head (-).  His BCx and UCx have been negative to date.  He was started on vanco/ceftriaxone/doxy.  He has defervesced.   HIV (-), Hepatitis A/B/C (-). RMSF (P).   Review of Systems: Review of Systems  Constitutional: Positive for fever and malaise/fatigue.  HENT: Negative for nosebleeds.   Eyes: Negative for blurred vision.  Cardiovascular: Positive for chest pain.  Genitourinary: Positive for dysuria.  Musculoskeletal: Positive for joint pain.  Skin: Negative for rash.  Neurological: Positive for headaches.  aching of L foot.  No oral ulcers, no genital sores.  Has been outside doing yardwork.  Works in [employer] Please see HPI. All other systems reviewed and negative.   Past Medical History:  Diagnosis Date  . Hallux valgus due to metatarsus primus varus 12/2014   right    Social History   Tobacco Use  . Smoking status: Never Smoker  . Smokeless tobacco: Never Used  Substance Use Topics  . Alcohol use: No  . Drug use: No    Family History  Problem Relation Age of Onset  . Hypertension Father   . Stroke Paternal Grandfather   . Cancer Neg Hx    Married. Grew up in [place]. Received his childhood vaccines.   Medications:  Scheduled: . enoxaparin (LOVENOX) injection  40 mg Subcutaneous Q24H  . potassium chloride  40 mEq Oral BID    Abtx:  Anti-infectives (From admission, onward)   Start     Dose/Rate Route Frequency Ordered Stop   09/05/17 1200  cefTRIAXone (ROCEPHIN) 1 g in sodium chloride 0.9 % 100 mL IVPB     1 g 200 mL/hr over 30 Minutes Intravenous Every 24 hours 09/04/17 2151     09/05/17 0600  doxycycline (VIBRAMYCIN) 100 mg in sodium chloride 0.9 % 250 mL IVPB     100 mg 125 mL/hr over 120 Minutes Intravenous Every 12 hours 09/04/17 2151     09/04/17 1857  doxycycline (VIBRAMYCIN) 100 MG injection    Note to Pharmacy:  [name] : cabinet override      09/04/17 1857 09/05/17 0659   09/04/17 1845  doxycycline  (VIBRAMYCIN) 200 mg in dextrose 5 % 250 mL IVPB     200 mg 125 mL/hr over 120 Minutes Intravenous  Once 09/04/17 1837 09/04/17 1958   09/04/17 1542  vancomycin (VANCOCIN) 500 MG powder    Note to Pharmacy:  [name] : cabinet override      09/04/17 1542 09/05/17 0344   09/04/17 1541  vancomycin (VANCOCIN) 1000 MG powder    Note to Pharmacy:  [name] : cabinet override      09/04/17 1541 09/05/17 0344   09/04/17 1515  cefTRIAXone (ROCEPHIN) 2 g in sodium chloride 0.9 % 100 mL IVPB     2 g 200 mL/hr over 30 Minutes Intravenous  Once 09/04/17 1510 09/04/17 1904   09/04/17 1515  vancomycin (VANCOCIN) 1,500 mg in sodium chloride 0.9 % 500 mL IVPB     1,500 mg 250 mL/hr over 120 Minutes Intravenous  Once 09/04/17 1510 09/04/17 1845        OBJECTIVE: Blood pressure (!) 104/45, pulse (!) 56, temperature (!) 97.5 F (36.4 C), resp. rate 16, height 5' 10"  (1.778 m), weight 75.5 kg (166 lb 8 oz), SpO2 100 %.  Physical Exam  Constitutional: He is oriented to person, place, and time. He appears well-developed and well-nourished. No distress.  HENT:  Head: Normocephalic and atraumatic.  Mouth/Throat: No oropharyngeal exudate.  Eyes: Pupils are equal, round, and reactive to light. Conjunctivae and EOM are normal.  Neck: Normal range of motion. Neck supple.  Cardiovascular: Normal rate, regular rhythm and normal heart sounds.  Pulmonary/Chest: Effort normal and breath sounds normal.  Abdominal: Soft. Bowel sounds are normal. There is no tenderness. There is no guarding.  Musculoskeletal: He exhibits no edema.       Feet:  Lymphadenopathy:    He has no cervical adenopathy.  Neurological: He is alert and oriented to person, place, and time. Coordination normal.  Skin: Skin is warm and dry. No rash noted. He is not diaphoretic.  Psychiatric: He has a normal mood and affect.    Lab Results Results for orders placed or performed during the hospital encounter of 09/04/17  (from the past 48 hour(s))  Culture, blood (routine x 2)     Status: None (Preliminary result)   Collection Time: 09/04/17  2:00 PM  Result Value Ref Range   Specimen Description      BLOOD RIGHT ANTECUBITAL Performed at [facility], [address] [place], [place] [address]    Special Requests      BOTTLES DRAWN AEROBIC AND ANAEROBIC Blood Culture results may not be optimal due to an inadequate volume of blood received in culture bottles  Performed at [facility], [address] [place], [place] [address]    Culture      NO GROWTH < 24 HOURS Performed at [place] [address], [place], [place] [address]    Report Status PENDING   Culture, blood (routine x 2)     Status: None (Preliminary result)   Collection Time: 09/04/17  2:10 PM  Result Value Ref Range   Specimen Description      BLOOD LEFT HAND Performed at [facility], [address] [place], [place] [address]    Special Requests      BOTTLES DRAWN AEROBIC AND ANAEROBIC Blood Culture results may not be optimal due to an inadequate volume of blood received in culture bottles Performed at [facility], [address] [place], [place] [address]    Culture      NO GROWTH < 24 HOURS Performed at [facility], [place] [address], [place], [place] [address]    Report Status PENDING   I-Stat CG4 Lactic Acid, ED     Status: Abnormal   Collection Time: 09/04/17  3:06 PM  Result Value Ref Range   Lactic Acid, Venous 3.08 (HH) 0.5 - 1.9 mmol/L   Comment NOTIFIED PHYSICIAN   CSF cell count with differential collection tube #: 1     Status: Abnormal   Collection Time: 09/04/17  4:38 PM  Result Value Ref Range   Tube # 1     Comment: Performed at [facility], [address] [place], [place] [address]   Color, CSF COLORLESS COLORLESS   Appearance, CSF CLEAR CLEAR   Supernatant NOT INDICATED    RBC Count, CSF 1 (H) 0 /cu mm   WBC, CSF 0 0 - 5 /cu mm    Other Cells, CSF TOO FEW TO COUNT, SMEAR AVAILABLE FOR REVIEW     Comment: Rare lymphs. Performed at [facility], [place] [address] [place], [place] [address]   CSF cell count with differential collection tube #: 4     Status: None   Collection Time: 09/04/17  4:38 PM  Result Value Ref Range   Tube # 4     Comment: Performed at [facility], [address] [place], [place] [address]   Color, CSF COLORLESS COLORLESS   Appearance, CSF CLEAR CLEAR   Supernatant NOT INDICATED    RBC Count, CSF 0 0 /cu mm   WBC, CSF 0 0 - 5 /cu mm   Other Cells, CSF TOO FEW TO COUNT, SMEAR AVAILABLE FOR REVIEW     Comment: Rare lymphs. Performed at [facility], [place] [address] [place], [place] [address]   CSF culture     Status: None (Preliminary result)   Collection Time: 09/04/17  4:38 PM  Result Value Ref Range   Specimen Description CSF    Special Requests      Normal Performed at [facility], [address] [place], [place] [address]    Gram Stain      WBC PRESENT, PREDOMINANTLY MONONUCLEAR NO ORGANISMS SEEN CYTOSPIN SMEAR    Culture      NO GROWTH 2 DAYS Performed at [facility], [place] [address], [place], [place] [address]    Report Status PENDING   Protein and glucose, CSF     Status: Abnormal   Collection Time: 09/04/17  4:38 PM  Result Value Ref Range   Glucose, CSF 66 40 - 70 mg/dL   Total  Protein, CSF 56 (H) 15 - 45 mg/dL    Comment: Performed at [facility], [address] [place], [place] [address]  I-Stat CG4 Lactic Acid, ED     Status: Abnormal   Collection Time: 09/04/17  6:42 PM  Result Value Ref Range   Lactic Acid, Venous 2.21 (HH) 0.5 - 1.9 mmol/L   Comment MD NOTIFIED, REPEAT TEST   Sedimentation rate     Status: None   Collection Time: 09/05/17  6:37 AM  Result Value Ref Range   Sed Rate 8 0 - 16 mm/hr    Comment: Performed at [facility], [place] [address] [place], [place] [address]  HIV  antibody (Routine Testing)     Status: None   Collection Time: 09/05/17  6:37 AM  Result Value Ref Range   HIV Screen 4th Generation wRfx Non Reactive Non Reactive    Comment: (NOTE) Performed At: [facility] [place], [place] [id number] Rush Farmer MD EK:[id number]   Comprehensive metabolic panel     Status: Abnormal   Collection Time: 09/05/17  6:37 AM  Result Value Ref Range   Sodium 137 135 - 145 mmol/L   Potassium 3.0 (L) 3.5 - 5.1 mmol/L   Chloride 104 98 - 111 mmol/L    Comment: Please note change in reference range.   CO2 25 22 - 32 mmol/L   Glucose, Bld 136 (H) 70 - 99 mg/dL    Comment: Please note change in reference range.   BUN 11 6 - 20 mg/dL    Comment: Please note change in reference range.   Creatinine, Ser 1.17 0.61 - 1.24 mg/dL   Calcium 8.2 (L) 8.9 - 10.3 mg/dL   Total Protein 6.6 6.5 - 8.1 g/dL   Albumin 3.3 (L) 3.5 - 5.0 g/dL   AST 17 15 - 41 U/L   ALT 17 0 - 44 U/L    Comment: Please note change in reference range.   Alkaline Phosphatase 41 38 - 126 U/L   Total Bilirubin 0.9 0.3 - 1.2 mg/dL   GFR calc non Af Amer >60 >60 mL/min   GFR calc Af Amer >60 >60 mL/min    Comment: (NOTE) The eGFR has been calculated using the CKD EPI equation. This calculation has not been validated in all clinical situations. eGFR's persistently <60 mL/min signify possible Chronic Kidney Disease.    Anion gap 8 5 - 15    Comment: Performed at [facility], [place] [address], [place], [place] [address]  CBC     Status: Abnormal   Collection Time: 09/05/17  6:37 AM  Result Value Ref Range   WBC 26.5 (H) 4.0 - 10.5 K/uL   RBC 4.91 4.22 - 5.81 MIL/uL   Hemoglobin 13.0 13.0 - 17.0 g/dL   HCT 39.2 39.0 - 52.0 %   MCV 79.8 78.0 - 100.0 fL   MCH 26.5 26.0 - 34.0 pg   MCHC 33.2 30.0 - 36.0 g/dL   RDW 13.5 11.5 - 15.5 %   Platelets 293 150 - 400 K/uL    Comment: Performed at [facility], [place] [address]  [place], [place] [address]  Urine rapid drug screen (hosp performed)     Status: Abnormal   Collection Time: 09/05/17  9:48 AM  Result Value Ref Range   Opiates NONE DETECTED NONE DETECTED  Cocaine NONE DETECTED NONE DETECTED   Benzodiazepines POSITIVE (A) NONE DETECTED   Amphetamines NONE DETECTED NONE DETECTED   Tetrahydrocannabinol NONE DETECTED NONE DETECTED   Barbiturates (A) NONE DETECTED    Result not available. Reagent lot number recalled by manufacturer.    Comment: Performed at [facility], [place] [address] [place], [place] [address]  Hepatitis panel, acute     Status: None   Collection Time: 09/05/17 10:22 AM  Result Value Ref Range   Hepatitis B Surface Ag Negative Negative   HCV Ab <0.1 0.0 - 0.9 s/co ratio    Comment: (NOTE)                                  Negative:     < 0.8                             Indeterminate: 0.8 - 0.9                                  Positive:     > 0.9 The CDC recommends that a positive HCV antibody result be followed up with a HCV Nucleic Acid Amplification test (370488). Performed At: [facility] [place], [place] [id number] Rush Farmer MD UU:[id number]    Hep A IgM Negative Negative   Hep B C IgM Negative Negative  CBC with Differential/Platelet     Status: Abnormal   Collection Time: 09/06/17 11:00 AM  Result Value Ref Range   WBC 18.6 (H) 4.0 - 10.5 K/uL   RBC 5.06 4.22 - 5.81 MIL/uL   Hemoglobin 13.4 13.0 - 17.0 g/dL   HCT 40.6 39.0 - 52.0 %   MCV 80.2 78.0 - 100.0 fL   MCH 26.5 26.0 - 34.0 pg   MCHC 33.0 30.0 - 36.0 g/dL   RDW 13.8 11.5 - 15.5 %   Platelets 281 150 - 400 K/uL   Neutrophils Relative % 81 %   Lymphocytes Relative 12 %   Monocytes Relative 6 %   Eosinophils Relative 1 %   Basophils Relative 0 %   Neutro Abs 15.1 (H) 1.7 - 7.7 K/uL   Lymphs Abs 2.2 0.7 - 4.0 K/uL   Monocytes Absolute 1.1 (H) 0.1 - 1.0 K/uL   Eosinophils Absolute 0.2 0.0 - 0.7 K/uL   Basophils  Absolute 0.0 0.0 - 0.1 K/uL    Comment: Performed at [facility], [place] [address] [place], [place] [address]      Component Value Date/Time   SDES CSF 09/04/2017 1638   SPECREQUEST  09/04/2017 1638    Normal Performed at [facility], [address] [place], [place] [address]    CULT  09/04/2017 1638    NO GROWTH 2 DAYS Performed at [facility], [place] [address], [place], [place] [address]    REPTSTATUS PENDING 09/04/2017 1638   Ct Head Wo Contrast  Result Date: 09/04/2017 CLINICAL DATA:  Two days of headache, fever, chills, weakness, and fatigue. EXAM: CT HEAD WITHOUT CONTRAST TECHNIQUE: Contiguous axial images were obtained from the base of the skull through the vertex without intravenous contrast. COMPARISON:  None. FINDINGS: Brain: There is no evidence of acute infarct, intracranial hemorrhage, mass, midline shift, or extra-axial fluid collection. The ventricles  and sulci are normal. Vascular: No hyperdense vessel. Skull: No fracture or focal osseous lesion. Sinuses/Orbits: Visualized paranasal sinuses and mastoid air cells are clear. Visualized orbits are unremarkable. Other: None. IMPRESSION: Negative head CT. Electronically Signed   By: Logan Bores M.D.   On: 09/04/2017 16:05   Ct Angio Chest Pe W/cm &/or Wo Cm  Result Date: 09/04/2017 CLINICAL DATA:  Nausea, fever, chills weakness and fatigue. Two days of headache. Current chest radiograph demonstrates an ill-defined nodule in the left upper lung. EXAM: CT ANGIOGRAPHY CHEST WITH CONTRAST TECHNIQUE: Multidetector CT imaging of the chest was performed using the standard protocol during bolus administration of intravenous contrast. Multiplanar CT image reconstructions and MIPs were obtained to evaluate the vascular anatomy. CONTRAST:  117m ISOVUE-370 IOPAMIDOL (ISOVUE-370) INJECTION 76% COMPARISON:  Chest radiograph, 09/04/2017 at 12:27 p.m. FINDINGS: Cardiovascular: Opacification of the pulmonary  arteries is slightly less than that of the aorta. This somewhat limits assessment of the segmental and subsegmental vessels for pulmonary emboli. Allowing for this mild limitation, there is no evidence of a pulmonary embolism. Heart is normal in size and configuration. No pericardial effusion. Great vessels normal caliber. No aortic dissection or atherosclerosis. Mediastinum/Nodes: No enlarged mediastinal, hilar, or axillary lymph nodes. Thyroid gland, trachea, and esophagus demonstrate no significant findings. Lungs/Pleura: No pulmonary nodule. The faint opacity noted on the current chest radiograph most likely due to superimposed structures. There are mild peribronchovascular reticular type opacities in the anteromedial upper lobes, consistent with scarring. Lungs are otherwise clear. No pleural effusion or pneumothorax. Upper Abdomen: Unremarkable. Musculoskeletal: No chest wall abnormality. No acute or significant osseous findings. Review of the MIP images confirms the above findings. IMPRESSION: 1. No evidence of a pulmonary embolism. 2. No acute findings. 3. No pulmonary mass or nodule. 4. Mild areas of reticular opacity in the upper lobes consistent with scarring or subsegmental atelectasis. Electronically Signed   By: DLajean ManesM.D.   On: 09/04/2017 15:04   Dg Foot Complete Left  Result Date: 09/04/2017 CLINICAL DATA:  Bunion surgery 2016.  Increasing foot pain EXAM: LEFT FOOT - COMPLETE 3+ VIEW COMPARISON:  None. FINDINGS: Prior bunionectomy. Two screws in the first metatarsal in satisfactory position without loosening. Mild degenerative change first metatarsal phalangeal joint. No acute bony abnormality IMPRESSION: Postsurgical changes first metatarsal.  No acute abnormality. Electronically Signed   By: CFranchot GalloM.D.   On: 09/04/2017 14:46   Recent Results (from the past 240 hour(s))  Urine culture     Status: None   Collection Time: 09/04/17 12:07 PM  Result Value Ref Range Status    Specimen Description   Final    URINE, CLEAN CATCH Performed at [facility] [address] [place] [address]   Special Requests   Final    NONE [facility]formed at [facility] [id number]   Culture   Final    NO GROWTH [facility]formed at [facility] [address], [place] [address]   Report Status 09/05/2017 FINAL  Final  Culture, blood (routine x 2)     Status: None (Preliminary result)   Collection Time: 09/04/17  2:00 PM  Result Value Ref Range Status   Specimen Description   Final    BLOOD RIGHT ANTECUBITAL [facility] at [facility] [address] [place] [id number]   Special Requests   Final    BOTTLES DRAWN  AEROBIC AND ANAEROBIC Blood Culture results may not be optimal due to an inadequate volume of blood received in culture bottles Performed at [facility], [address] [place], [place] [address]    Culture   Final    NO GROWTH < 24 HOURS Performed at [facility], [place] [address] [place], [place] [address]    Report Status PENDING  Incomplete  Culture, blood (routine x 2)     Status: None (Preliminary result)   Collection Time: 09/04/17  2:10 PM  Result Value Ref Range Status   Specimen Description   Final    BLOOD LEFT HAND Performed at [facility], [address] [place], [place] [address]    Special Requests   Final    BOTTLES DRAWN AEROBIC AND ANAEROBIC Blood Culture results may not be optimal due to an inadequate volume of blood received in culture bottles Performed at [facility], [address] [place], [place] [address]    Culture   Final    NO GROWTH < 24 HOURS Performed at [facility], [place] [address] [place], [place] [address]    Report Status PENDING  Incomplete  CSF culture     Status: None (Preliminary result)   Collection Time: 09/04/17  4:38 PM  Result Value Ref Range Status   Specimen Description  CSF  Final   Special Requests   Final    Normal Performed at [facility], [address] [place], [place] [address]    Gram Stain   Final    WBC PRESENT, PREDOMINANTLY MONONUCLEAR NO ORGANISMS SEEN CYTOSPIN SMEAR    Culture   Final    NO GROWTH 2 DAYS Performed at [facility], [place] [address], [place], [place] [address]    Report Status PENDING  Incomplete    Microbiology: Recent Results (from the past 240 hour(s))  Urine culture     Status: None   Collection Time: 09/04/17 12:07 PM  Result Value Ref Range Status   Specimen Description   Final    URINE, CLEAN CATCH Performed at [facility], [address] [place], [place] [address]    Special Requests   Final    NONE Performed at [facility], [address] [place], [place] [address]    Culture   Final    NO GROWTH Performed at [facility], [place] [address] [place], [place] [address]    Report Status 09/05/2017 FINAL  Final  Culture, blood (routine x 2)     Status: None (Preliminary result)   Collection Time: 09/04/17  2:00 PM  Result Value Ref Range Status   Specimen Description   Final    BLOOD RIGHT ANTECUBITAL Performed at [facility], [address] [place], [place] [address]    Special Requests   Final    BOTTLES DRAWN AEROBIC AND ANAEROBIC Blood Culture results may not be optimal due to an inadequate volume of blood received in culture bottles Performed at [facility], [address] [place], [place] [address]    Culture   Final    NO GROWTH < 24 HOURS Performed at [facility], [place] [address] [place], [place] [address]    Report Status PENDING  Incomplete  Culture, blood (routine x 2)     Status: None (Preliminary result)   Collection Time:  09/04/17  2:10 PM  Result Value Ref Range Status   Specimen Description   Final    BLOOD LEFT HAND Performed at [facility], [address] [place], [place] [address]      Special Requests   Final    BOTTLES DRAWN AEROBIC AND ANAEROBIC Blood Culture results may not be optimal due to an inadequate volume of blood received in culture bottles Performed at [facility], [address] [place], [place] [address]    Culture   Final    NO GROWTH < 24 HOURS Performed at [facility], [place] [address], [place], [place] [address]    Report Status PENDING  Incomplete  CSF culture     Status: None (Preliminary result)   Collection Time: 09/04/17  4:38 PM  Result Value Ref Range Status   Specimen Description CSF  Final   Special Requests   Final    Normal Performed at [facility], [address] [place], [place] [address]    Gram Stain   Final    WBC PRESENT, PREDOMINANTLY MONONUCLEAR NO ORGANISMS SEEN CYTOSPIN SMEAR    Culture   Final    NO GROWTH 2 DAYS Performed at [facility], [place] [address] [place], [place] [address]    Report Status PENDING  Incomplete    Radiographs and labs were personally reviewed by me.   Bobby Rumpf, MD [facility] for [facility] [place] [facility] [phone] 09/06/2017, 1:47 PM

## 2017-09-06 NOTE — Progress Notes (Addendum)
[facility] PROGRESS NOTE    Progress Note  Daniel Mcclure.  WER:[id number] DOB: Apr 30, 1980 DOA: 09/04/2017 PCP: Brunetta Jeans, PA-C     Brief Narrative:   Daniel Critzer. is an 37 y.o. male complaining of fever chills since yesterday and urgency.  In the ED found to be febrile, CT scan of the head negative  Assessment/Plan:   Principal Problem:   Sepsis ([place]) Active Problems:   Leukocytosis   Orthostatic hypotension  Of unclear source.  He is now afebrile.  He relates that he has not had tics in his body, they were spiders, but he did not get bit by spider.  His wife confirms this. He relates he is not having diarrhea he denies any abdominal pain and these are within normal limits, his last bowel movement was this morning. HIV and hepatitis panel are negative, CSF show 0 white blood cells, CT Angie of the chest showed no PE or infiltrates.   Had no recent urological or oral procedures. Culture data has remained negative. Culture data has remained negative. Continue IV vancomycin and Zosyn. Lightheadedness is resolved. He denies any abdominal pain bowel movements have not changed, he has not had any recent urological or oral procedures. We will consult ID.  DVT prophylaxis: lovenox Family Communication:none Disposition Plan/Barrier to D/C: unable to determine Code Status:     Code Status Orders  (From admission, onward)        Start     Ordered   09/04/17 2148  Full code  Continuous     09/04/17 2150    Code Status History    This patient has a current code status but no historical code status.        IV Access:    Peripheral IV   Procedures and diagnostic studies:   Dg Chest 2 View  Result Date: 09/04/2017 CLINICAL DATA:  Cough, fever, and weakness for 2 days. EXAM: CHEST - 2 VIEW COMPARISON:  None. FINDINGS: The heart size and mediastinal contours are within normal limits. Both lungs are clear. Scarring is seen in the right upper  lobe. An asymmetric ill-defined nodular density is seen in the left upper lung on the frontal projection. No evidence of pulmonary infiltrate or edema. No evidence of pleural effusion. IMPRESSION: Ill-defined nodular density in left upper lung. Recommend chest CT without contrast for further evaluation. Right upper lobe scarring. Electronically Signed   By: Earle Gell M.D.   On: 09/04/2017 12:09   Ct Head Wo Contrast  Result Date: 09/04/2017 CLINICAL DATA:  Two days of headache, fever, chills, weakness, and fatigue. EXAM: CT HEAD WITHOUT CONTRAST TECHNIQUE: Contiguous axial images were obtained from the base of the skull through the vertex without intravenous contrast. COMPARISON:  None. FINDINGS: Brain: There is no evidence of acute infarct, intracranial hemorrhage, mass, midline shift, or extra-axial fluid collection. The ventricles and sulci are normal. Vascular: No hyperdense vessel. Skull: No fracture or focal osseous lesion. Sinuses/Orbits: Visualized paranasal sinuses and mastoid air cells are clear. Visualized orbits are unremarkable. Other: None. IMPRESSION: Negative head CT. Electronically Signed   By: Logan Bores M.D.   On: 09/04/2017 16:05   Ct Angio Chest Pe W/cm &/or Wo Cm  Result Date: 09/04/2017 CLINICAL DATA:  Nausea, fever, chills weakness and fatigue. Two days of headache. Current chest radiograph demonstrates an ill-defined nodule in the left upper lung. EXAM: CT ANGIOGRAPHY CHEST WITH CONTRAST TECHNIQUE: Multidetector CT imaging of the chest was performed using the  standard protocol during bolus administration of intravenous contrast. Multiplanar CT image reconstructions and MIPs were obtained to evaluate the vascular anatomy. CONTRAST:  130m ISOVUE-370 IOPAMIDOL (ISOVUE-370) INJECTION 76% COMPARISON:  Chest radiograph, 09/04/2017 at 12:27 p.m. FINDINGS: Cardiovascular: Opacification of the pulmonary arteries is slightly less than that of the aorta. This somewhat limits assessment of  the segmental and subsegmental vessels for pulmonary emboli. Allowing for this mild limitation, there is no evidence of a pulmonary embolism. Heart is normal in size and configuration. No pericardial effusion. Great vessels normal caliber. No aortic dissection or atherosclerosis. Mediastinum/Nodes: No enlarged mediastinal, hilar, or axillary lymph nodes. Thyroid gland, trachea, and esophagus demonstrate no significant findings. Lungs/Pleura: No pulmonary nodule. The faint opacity noted on the current chest radiograph most likely due to superimposed structures. There are mild peribronchovascular reticular type opacities in the anteromedial upper lobes, consistent with scarring. Lungs are otherwise clear. No pleural effusion or pneumothorax. Upper Abdomen: Unremarkable. Musculoskeletal: No chest wall abnormality. No acute or significant osseous findings. Review of the MIP images confirms the above findings. IMPRESSION: 1. No evidence of a pulmonary embolism. 2. No acute findings. 3. No pulmonary mass or nodule. 4. Mild areas of reticular opacity in the upper lobes consistent with scarring or subsegmental atelectasis. Electronically Signed   By: DLajean ManesM.D.   On: 09/04/2017 15:04   Dg Foot Complete Left  Result Date: 09/04/2017 CLINICAL DATA:  Bunion surgery 2016.  Increasing foot pain EXAM: LEFT FOOT - COMPLETE 3+ VIEW COMPARISON:  None. FINDINGS: Prior bunionectomy. Two screws in the first metatarsal in satisfactory position without loosening. Mild degenerative change first metatarsal phalangeal joint. No acute bony abnormality IMPRESSION: Postsurgical changes first metatarsal.  No acute abnormality. Electronically Signed   By: CFranchot GalloM.D.   On: 09/04/2017 14:46     Medical Consultants:    None.  Anti-Infectives:   IV vancomycin and Zosyn  Subjective:    Daniel R Schiller Jr. relates his headaches are improving, no further lightheadedness upon standing, he does not like the food in  the hospital.  Objective:    Vitals:   09/05/17 1445 09/05/17 2120 09/06/17 0543 09/06/17 0841  BP: 126/64 (!) 106/58 (!) 104/45   Pulse: 77 67 (!) 56   Resp: 18 20 16    Temp: 99.2 F (37.3 C) 98.2 F (36.8 C) (!) 97.5 F (36.4 C)   TempSrc:      SpO2: 100% 99% 100% 100%  Weight:      Height:        Intake/Output Summary (Last 24 hours) at 09/06/2017 1024 Last data filed at 09/06/2017 0842 Gross per 24 hour  Intake 2200.42 ml  Output 1525 ml  Net 675.42 ml   Filed Weights   09/04/17 1152 09/04/17 1154 09/05/17 0522  Weight: 74.4 kg (164 lb) 74.4 kg (164 lb) 75.5 kg (166 lb 8 oz)    Exam: General exam: In no acute distress. Respiratory system: Good air movement and clear to auscultation. Cardiovascular system: S1 & S2 heard, RRR. No JVD, murmurs. Gastrointestinal system: Abdomen is nondistended, soft and nontender.  Central nervous system: Alert and oriented. No focal neurological deficits. Extremities: No pedal edema. Skin: No rashes, lesions or ulcers Psychiatry: Judgement and insight appear normal. Mood & affect appropriate.    Data Reviewed:    Labs: Basic Metabolic Panel: Recent Labs  Lab 09/04/17 1243 09/05/17 0637  NA 137 137  K 3.8 3.0*  CL 101 104  CO2 26 25  GLUCOSE 124*  136*  BUN 11 11  CREATININE 1.09 1.17  CALCIUM 9.1 8.2*   GFR Estimated Creatinine Clearance: 89.3 mL/min (by C-G formula based on SCr of 1.17 mg/dL). Liver Function Tests: Recent Labs  Lab 09/04/17 1243 09/05/17 0637  AST 27 17  ALT 23 17  ALKPHOS 49 41  BILITOT 0.7 0.9  PROT 7.9 6.6  ALBUMIN 4.3 3.3*   No results for input(s): LIPASE, AMYLASE in the last 168 hours. No results for input(s): AMMONIA in the last 168 hours. Coagulation profile No results for input(s): INR, PROTIME in the last 168 hours.  CBC: Recent Labs  Lab 09/04/17 1243 09/05/17 0637  WBC 23.2* 26.5*  NEUTROABS 19.5*  --   HGB 14.8 13.0  HCT 43.3 39.2  MCV 78.3 79.8  PLT 344 293    Cardiac Enzymes: Recent Labs  Lab 09/04/17 1243  TROPONINI <0.03   BNP (last 3 results) No results for input(s): PROBNP in the last 8760 hours. CBG: No results for input(s): GLUCAP in the last 168 hours. D-Dimer: No results for input(s): DDIMER in the last 72 hours. Hgb A1c: No results for input(s): HGBA1C in the last 72 hours. Lipid Profile: No results for input(s): CHOL, HDL, LDLCALC, TRIG, CHOLHDL, LDLDIRECT in the last 72 hours. Thyroid function studies: No results for input(s): TSH, T4TOTAL, T3FREE, THYROIDAB in the last 72 hours.  Invalid input(s): FREET3 Anemia work up: No results for input(s): VITAMINB12, FOLATE, FERRITIN, TIBC, IRON, RETICCTPCT in the last 72 hours. Sepsis Labs: Recent Labs  Lab 09/04/17 1243 09/04/17 1254 09/04/17 1506 09/04/17 1842 09/05/17 0637  WBC 23.2*  --   --   --  26.5*  LATICACIDVEN  --  2.85* 3.08* 2.21*  --    Microbiology Recent Results (from the past 240 hour(s))  Urine culture     Status: None   Collection Time: 09/04/17 12:07 PM  Result Value Ref Range Status   Specimen Description   Final    URINE, CLEAN CATCH Performed at [facility], [address] [place], [place] [address]    Special Requests   Final    NONE Performed at [facility], [address] [place], [place] [address]    Culture   Final    NO GROWTH Performed at [facility], [place] [address] [place], [place] [address]    Report Status 09/05/2017 FINAL  Final  Culture, blood (routine x 2)     Status: None (Preliminary result)   Collection Time: 09/04/17  2:00 PM  Result Value Ref Range Status   Specimen Description   Final    BLOOD RIGHT ANTECUBITAL Performed at [facility], [address] [place], [place] [address]    Special Requests   Final    BOTTLES DRAWN AEROBIC AND ANAEROBIC Blood Culture results may not be optimal due to an inadequate volume of blood received in culture bottles Performed at [facility], [address] [place], [place] [address]    Culture   Final    NO GROWTH < 24 HOURS Performed at [facility], [place] [address], [place], [place] [address]    Report Status PENDING  Incomplete  Culture, blood (routine x 2)     Status: None (Preliminary result)   Collection Time: 09/04/17  2:10 PM  Result Value Ref Range Status   Specimen Description   Final    BLOOD LEFT HAND Performed at [facility], [address] [place], [place] [address]    Special Requests   Final    BOTTLES DRAWN AEROBIC AND ANAEROBIC Blood Culture results may not be optimal due to an inadequate volume of blood received in culture bottles Performed at [facility], [address] [place], [place] [address]    Culture   Final    NO GROWTH < 24 HOURS Performed at [facility], [place] [address] [place], [place] [address]    Report Status PENDING  Incomplete  CSF culture     Status: None (Preliminary result)   Collection Time: 09/04/17  4:38 PM  Result Value Ref Range Status   Specimen Description CSF  Final   Special Requests   Final    Normal Performed at [facility], [address] [place], [place] [address]    Gram Stain   Final    WBC PRESENT, PREDOMINANTLY MONONUCLEAR NO ORGANISMS SEEN CYTOSPIN SMEAR    Culture   Final    NO GROWTH 2 DAYS Performed at [facility], [place] [address], [place], [place] [address]    Report Status PENDING  Incomplete     Medications:   . enoxaparin (LOVENOX) injection  40 mg Subcutaneous Q24H   Continuous Infusions: . cefTRIAXone (ROCEPHIN)  IV Stopped (09/05/17 1253)  . doxycycline (VIBRAMYCIN) IV 100 mg (09/06/17 0541)     LOS: 2 days   [facility] Pager [phone]  *Please refer to [email or web address], password [email or web address] to get updated schedule on who will round on this patient, as hospitalists switch teams weekly. If 7PM-7AM, please contact night-coverage at  [email or web address], password [email or web address] for any overnight needs.  09/06/2017, 10:24 AM

## 2017-09-07 DIAGNOSIS — N400 Enlarged prostate without lower urinary tract symptoms: Secondary | ICD-10-CM

## 2017-09-07 LAB — CSF CULTURE W GRAM STAIN
Culture: NO GROWTH
Special Requests: NORMAL

## 2017-09-07 LAB — BASIC METABOLIC PANEL
Anion gap: 7 (ref 5–15)
BUN: 9 mg/dL (ref 6–20)
CHLORIDE: 107 mmol/L (ref 98–111)
CO2: 27 mmol/L (ref 22–32)
CREATININE: 0.89 mg/dL (ref 0.61–1.24)
Calcium: 8.9 mg/dL (ref 8.9–10.3)
GFR calc non Af Amer: >60 mL/min (ref >60)
Glucose, Bld: 99 mg/dL (ref 70–99)
POTASSIUM: 4.5 mmol/L (ref 3.5–5.1)
Sodium: 141 mmol/L (ref 135–145)

## 2017-09-07 LAB — RPR: RPR Ser Ql: NONREACTIVE

## 2017-09-07 LAB — CBC WITH DIFFERENTIAL/PLATELET
BASOS PCT: 0 %
Basophils Absolute: 0 10*3/uL (ref 0.0–0.1)
EOS ABS: 0.3 10*3/uL (ref 0.0–0.7)
Eosinophils Relative: 3 %
HCT: 39 % (ref 39.0–52.0)
HEMOGLOBIN: 12.9 g/dL — AB (ref 13.0–17.0)
Lymphocytes Relative: 24 %
Lymphs Abs: 2 10*3/uL (ref 0.7–4.0)
MCH: 26.5 pg (ref 26.0–34.0)
MCHC: 33.1 g/dL (ref 30.0–36.0)
MCV: 80.2 fL (ref 78.0–100.0)
MONO ABS: 0.9 10*3/uL (ref 0.1–1.0)
MONOS PCT: 11 %
NEUTROS PCT: 62 %
Neutro Abs: 5.1 10*3/uL (ref 1.7–7.7)
PLATELETS: 340 10*3/uL (ref 150–400)
RBC: 4.86 MIL/uL (ref 4.22–5.81)
RDW: 13.8 % (ref 11.5–15.5)
WBC: 8.3 10*3/uL (ref 4.0–10.5)

## 2017-09-07 LAB — HIV-1 RNA QUANT-NO REFLEX-BLD
HIV 1 RNA Quant: <20 copies/mL
LOG10 HIV-1 RNA: UNDETERMINED {Log_copies}/mL

## 2017-09-07 LAB — RMSF, IGG, IFA: RMSF, IGG, IFA: 1:64 {titer} — ABNORMAL HIGH

## 2017-09-07 LAB — ROCKY MTN SPOTTED FVR ABS PNL(IGG+IGM)
RMSF IgG: POSITIVE — AB
RMSF IgM: 0.22 index (ref 0.00–0.89)

## 2017-09-07 LAB — EHRLICHIA ANTIBODY PANEL
E CHAFFEENSIS AB, IGM: NEGATIVE
E chaffeensis (HGE) Ab, IgG: NEGATIVE
E. CHAFFEENSIS IGG AB: NEGATIVE
E. Chaffeensis (HME) IgM Titer: NEGATIVE

## 2017-09-07 MED ORDER — SODIUM CHLORIDE 0.9 % IV BOLUS
1000.0000 mL | Freq: Once | INTRAVENOUS | Status: AC
Start: 2017-09-07 — End: 2017-09-07
  Administered 2017-09-07: 1000 mL via INTRAVENOUS

## 2017-09-07 MED ORDER — DOXYCYCLINE HYCLATE 100 MG PO TABS
100.0000 mg | ORAL_TABLET | Freq: Two times a day (BID) | ORAL | Status: DC
  Administered 2017-09-07 – 2017-09-08 (×2): 100 mg via ORAL
  Filled 2017-09-07 (×3): qty 1

## 2017-09-07 MED ORDER — TAMSULOSIN HCL 0.4 MG PO CAPS
0.4000 mg | ORAL_CAPSULE | Freq: Every day | ORAL | Status: DC
  Administered 2017-09-07 – 2017-09-08 (×2): 0.4 mg via ORAL
  Filled 2017-09-07 (×2): qty 1

## 2017-09-07 MED ORDER — SODIUM CHLORIDE 0.9 % IV BOLUS
500.0000 mL | Freq: Once | INTRAVENOUS | Status: AC
  Administered 2017-09-07: 500 mL via INTRAVENOUS

## 2017-09-07 MED ORDER — TRAMADOL HCL 50 MG PO TABS
100.0000 mg | ORAL_TABLET | Freq: Four times a day (QID) | ORAL | Status: DC | PRN
  Administered 2017-09-07 – 2017-09-08 (×4): 100 mg via ORAL
  Filled 2017-09-07 (×4): qty 2

## 2017-09-07 MED ORDER — SODIUM CHLORIDE 0.9 % IV SOLN
INTRAVENOUS | Status: DC
  Administered 2017-09-07 (×2): via INTRAVENOUS

## 2017-09-07 NOTE — Progress Notes (Signed)
PHARMACIST - PHYSICIAN COMMUNICATION  CONCERNING: Antibiotic IV to Oral Route Change Policy  RECOMMENDATION: This patient is receiving doxycycline by the intravenous route.  Based on criteria approved by the Pharmacy and Therapeutics Committee, the antibiotic(s) is/are being converted to the equivalent oral dose form(s).   DESCRIPTION: These criteria include:  Patient being treated for a respiratory tract infection, urinary tract infection, cellulitis or clostridium difficile associated diarrhea if on metronidazole  The patient is not neutropenic and does not exhibit a GI malabsorption state  The patient is eating (either orally or via tube) and/or has been taking other orally administered medications for a least 24 hours  The patient is improving clinically and has a Tmax < 100.5  If you have questions about this conversion, please contact the Pharmacy Department  []   [phone] )  [facility] []   [phone] )  [facility] []   [phone] )  [facility] []   [phone] )  [facility] [x]   [phone] )  [place], [place].D 09/07/2017 8:47 AM

## 2017-09-07 NOTE — Progress Notes (Addendum)
[facility] PROGRESS NOTE    Progress Note  Gaberiel Glori Bickers.  FXT:[id number] DOB: 10/18/1980 DOA: 09/04/2017 PCP: Brunetta Jeans, PA-C     Brief Narrative:   Igor Bishop. is an 37 y.o. male complaining of fever chills since yesterday and urgency.  In the ED found to be febrile, CT scan of the head negative  Assessment/Plan:   Principal Problem:   SIRS (systemic inflammatory response syndrome) (HCC) Active Problems:   Leukocytosis   Orthostatic hypotension   Enlarged prostate   Had no recent urological or oral procedures. Cup has been negative ID was consulted order HIV RNA urine GC and chlamydia and her leg antibiotics. Renal ultrasound showed an enlarged prostate, will start him empirically on Flomax. Culture data has remained negative. Culture data has remained negative. Continue IV Rocephin and doxycycline. He still relates dizziness upon standing we will continue IV fluids monitor strict I's and O's recheck orthostatics in the morning.  DVT prophylaxis: lovenox Family Communication:none Disposition Plan/Barrier to D/C: unable to determine Code Status:     Code Status Orders  (From admission, onward)        Start     Ordered   09/04/17 2148  Full code  Continuous     09/04/17 2150    Code Status History    This patient has a current code status but no historical code status.        IV Access:    Peripheral IV   Procedures and diagnostic studies:   US Renal  Result Date: 09/06/2017 CLINICAL DATA:  Dysuria EXAM: RENAL / URINARY TRACT ULTRASOUND COMPLETE COMPARISON:  None. FINDINGS: Right Kidney: Length: 13.1 cm. Echogenicity within normal limits. No mass or hydronephrosis visualized. Left Kidney: Length: 12.8 cm. Echogenicity within normal limits. No mass or hydronephrosis visualized. Bladder: Appears normal for degree of bladder distention. Prominent prostate at 5.2 x 4.6 x 5.8 cm impressing upon the base of the bladder.  IMPRESSION: Enlarged prostate measuring 5.2 x 4.6 x 5.8 cm impressing upon the base of the bladder. Otherwise negative. Electronically Signed   By: Ashley Royalty M.D.   On: 09/06/2017 20:21     Medical Consultants:    None.  Anti-Infectives:   IV vancomycin and Zosyn  Subjective:    Robley Fries. he relates he continues to have persistent headaches and lightheadedness especially when standing up.  Objective:    Vitals:   09/06/17 0543 09/06/17 0841 09/06/17 1408 09/06/17 2023  BP: (!) 104/45  114/69 (!) 93/55  Pulse: (!) 56  66 (!) 57  Resp: 16  16 16   Temp: (!) 97.5 F (36.4 C)  98.6 F (37 C) 97.9 F (36.6 C)  TempSrc:   Oral Oral  SpO2: 100% 100% 100% 100%  Weight:      Height:        Intake/Output Summary (Last 24 hours) at 09/07/2017 1356 Last data filed at 09/07/2017 1026 Gross per 24 hour  Intake 240 ml  Output 1950 ml  Net -1710 ml   Filed Weights   09/04/17 1152 09/04/17 1154 09/05/17 0522  Weight: 74.4 kg (164 lb) 74.4 kg (164 lb) 75.5 kg (166 lb 8 oz)    Exam: General exam: In no acute distress. Respiratory system: Good air movement and clear to auscultation. Cardiovascular system: S1 & S2 heard, RRR. No JVD, murmurs. Gastrointestinal system: Abdomen is nondistended, soft and nontender.  Central nervous system: Alert and oriented. No focal neurological deficits. Extremities: No  pedal edema. Skin: No rashes, lesions or ulcers Psychiatry: Judgement and insight appear normal. Mood & affect appropriate.    Data Reviewed:    Labs: Basic Metabolic Panel: Recent Labs  Lab 09/04/17 1243 09/05/17 0637 09/07/17 0653  NA 137 137 141  K 3.8 3.0* 4.5  CL 101 104 107  CO2 26 25 27   GLUCOSE 124* 136* 99  BUN 11 11 9   CREATININE 1.09 1.17 0.89  CALCIUM 9.1 8.2* 8.9   GFR Estimated Creatinine Clearance: 117.3 mL/min (by C-G formula based on SCr of 0.89 mg/dL). Liver Function Tests: Recent Labs  Lab 09/04/17 1243 09/05/17 0637  AST 27  17  ALT 23 17  ALKPHOS 49 41  BILITOT 0.7 0.9  PROT 7.9 6.6  ALBUMIN 4.3 3.3*   No results for input(s): LIPASE, AMYLASE in the last 168 hours. No results for input(s): AMMONIA in the last 168 hours. Coagulation profile No results for input(s): INR, PROTIME in the last 168 hours.  CBC: Recent Labs  Lab 09/04/17 1243 09/05/17 0637 09/06/17 1100 09/07/17 0653  WBC 23.2* 26.5* 18.6* 8.3  NEUTROABS 19.5*  --  15.1* 5.1  HGB 14.8 13.0 13.4 12.9*  HCT 43.3 39.2 40.6 39.0  MCV 78.3 79.8 80.2 80.2  PLT 344 293 281 340   Cardiac Enzymes: Recent Labs  Lab 09/04/17 1243  TROPONINI <0.03   BNP (last 3 results) No results for input(s): PROBNP in the last 8760 hours. CBG: No results for input(s): GLUCAP in the last 168 hours. D-Dimer: No results for input(s): DDIMER in the last 72 hours. Hgb A1c: No results for input(s): HGBA1C in the last 72 hours. Lipid Profile: No results for input(s): CHOL, HDL, LDLCALC, TRIG, CHOLHDL, LDLDIRECT in the last 72 hours. Thyroid function studies: No results for input(s): TSH, T4TOTAL, T3FREE, THYROIDAB in the last 72 hours.  Invalid input(s): FREET3 Anemia work up: No results for input(s): VITAMINB12, FOLATE, FERRITIN, TIBC, IRON, RETICCTPCT in the last 72 hours. Sepsis Labs: Recent Labs  Lab 09/04/17 1243 09/04/17 1254 09/04/17 1506 09/04/17 1842 09/05/17 0637 09/06/17 1100 09/07/17 0653  WBC 23.2*  --   --   --  26.5* 18.6* 8.3  LATICACIDVEN  --  2.85* 3.08* 2.21*  --   --   --    Microbiology Recent Results (from the past 240 hour(s))  Urine culture     Status: None   Collection Time: 09/04/17 12:07 PM  Result Value Ref Range Status   Specimen Description   Final    URINE, CLEAN CATCH Performed at [facility], [address] [place], [place] [address]    Special Requests   Final    NONE Performed at [facility], [address] [place], [place] [address]    Culture   Final    NO  GROWTH Performed at [facility], [place] [address] [place], [place] [address]    Report Status 09/05/2017 FINAL  Final  Culture, blood (routine x 2)     Status: None (Preliminary result)   Collection Time: 09/04/17  2:00 PM  Result Value Ref Range Status   Specimen Description   Final    BLOOD RIGHT ANTECUBITAL Performed at [facility], [address] [place], [place] [address]    Special Requests   Final    BOTTLES DRAWN AEROBIC AND ANAEROBIC Blood Culture results may not be optimal due to an inadequate volume of blood received in culture bottles  Performed at [facility], [address] [place], [place] [address]    Culture   Final    NO GROWTH 2 DAYS Performed at [facility], [place] [address] [place], [place] [address]    Report Status PENDING  Incomplete  Culture, blood (routine x 2)     Status: None (Preliminary result)   Collection Time: 09/04/17  2:10 PM  Result Value Ref Range Status   Specimen Description   Final    BLOOD LEFT HAND Performed at [facility], [address] [place], [place] [address]    Special Requests   Final    BOTTLES DRAWN AEROBIC AND ANAEROBIC Blood Culture results may not be optimal due to an inadequate volume of blood received in culture bottles Performed at [facility], [address] [place], [place] [address]    Culture   Final    NO GROWTH 2 DAYS Performed at [facility], [place] [address], [place], [place] [address]    Report Status PENDING  Incomplete  CSF culture     Status: None (Preliminary result)   Collection Time: 09/04/17  4:38 PM  Result Value Ref Range Status   Specimen Description CSF  Final   Special Requests   Final    Normal Performed at [facility], [address] [place], [place] [address]    Gram Stain   Final    WBC PRESENT, PREDOMINANTLY MONONUCLEAR NO ORGANISMS SEEN CYTOSPIN SMEAR    Culture   Final    NO GROWTH 3 DAYS Performed at  [facility], [place] [address], [place], [place] [address]    Report Status PENDING  Incomplete     Medications:   . doxycycline  100 mg Oral Q12H  . enoxaparin (LOVENOX) injection  40 mg Subcutaneous Q24H  . tamsulosin  0.4 mg Oral Daily   Continuous Infusions: . sodium chloride 100 mL/hr at 09/07/17 1026     LOS: 3 days   [place] [facility] Pager [phone]  *Please refer to [email or web address], password [email or web address] to get updated schedule on who will round on this patient, as hospitalists switch teams weekly. If 7PM-7AM, please contact night-coverage at [email or web address], password [email or web address] for any overnight needs.  09/07/2017, 1:56 PM

## 2017-09-07 NOTE — Progress Notes (Addendum)
INFECTIOUS DISEASE PROGRESS NOTE  ID: Daniel Mcclure. is a 37 y.o. male with  Principal Problem:   SIRS (systemic inflammatory response syndrome) (HCC) Active Problems:   Leukocytosis   Orthostatic hypotension  Subjective: C/o severe headache o/n C/o neck soreness  Abtx:  Anti-infectives (From admission, onward)   Start     Dose/Rate Route Frequency Ordered Stop   09/07/17 1800  doxycycline (VIBRA-TABS) tablet 100 mg     100 mg Oral Every 12 hours 09/07/17 0846     09/05/17 1200  cefTRIAXone (ROCEPHIN) 1 g in sodium chloride 0.9 % 100 mL IVPB     1 g 200 mL/hr over 30 Minutes Intravenous Every 24 hours 09/04/17 2151     09/05/17 0600  doxycycline (VIBRAMYCIN) 100 mg in sodium chloride 0.9 % 250 mL IVPB  Status:  Discontinued     100 mg 125 mL/hr over 120 Minutes Intravenous Every 12 hours 09/04/17 2151 09/07/17 0846   09/04/17 1857  doxycycline (VIBRAMYCIN) 100 MG injection    Note to Pharmacy:  Daniel Mcclure : cabinet override      09/04/17 1857 09/05/17 0659   09/04/17 1845  doxycycline (VIBRAMYCIN) 200 mg in dextrose 5 % 250 mL IVPB     200 mg 125 mL/hr over 120 Minutes Intravenous  Once 09/04/17 1837 09/04/17 1958   09/04/17 1542  vancomycin (VANCOCIN) 500 MG powder    Note to Pharmacy:  Daniel Mcclure : cabinet override      09/04/17 1542 09/05/17 0344   09/04/17 1541  vancomycin (VANCOCIN) 1000 MG powder    Note to Pharmacy:  Daniel Mcclure : cabinet override      09/04/17 1541 09/05/17 0344   09/04/17 1515  cefTRIAXone (ROCEPHIN) 2 g in sodium chloride 0.9 % 100 mL IVPB     2 g 200 mL/hr over 30 Minutes Intravenous  Once 09/04/17 1510 09/04/17 1904   09/04/17 1515  vancomycin (VANCOCIN) 1,500 mg in sodium chloride 0.9 % 500 mL IVPB     1,500 mg 250 mL/hr over 120 Minutes Intravenous  Once 09/04/17 1510 09/04/17 1845      Medications:  Scheduled: . doxycycline  100 mg Oral Q12H  . enoxaparin (LOVENOX) injection  40 mg Subcutaneous Q24H    . tamsulosin  0.4 mg Oral Daily    Objective: Vital signs in last 24 hours: Temp:  [97.9 F (36.6 C)-98.6 F (37 C)] 97.9 F (36.6 C) (07/17 2023) Pulse Rate:  [57-66] 57 (07/17 2023) Resp:  [16] 16 (07/17 2023) BP: (93-114)/(55-69) 93/55 (07/17 2023) SpO2:  [100 %] 100 % (07/17 2023)   General appearance: alert, cooperative and no distress Neck: FROM Resp: clear to auscultation bilaterally Cardio: regular rate and rhythm GI: normal findings: bowel sounds normal and soft, non-tender Extremities: edema none  Lab Results Recent Labs    09/05/17 0637 09/06/17 1100 09/07/17 0653  WBC 26.5* 18.6* 8.3  HGB 13.0 13.4 12.9*  HCT 39.2 40.6 39.0  NA 137  --  141  K 3.0*  --  4.5  CL 104  --  107  CO2 25  --  27  BUN 11  --  9  CREATININE 1.17  --  0.89   Liver Panel Recent Labs    09/04/17 1243 09/05/17 0637  PROT 7.9 6.6  ALBUMIN 4.3 3.3*  AST 27 17  ALT 23 17  ALKPHOS 49 41  BILITOT 0.7 0.9   Sedimentation Rate Recent Labs    09/05/17 8676  ESRSEDRATE 8   C-Reactive Protein No results for input(s): CRP in the last 72 hours.  Microbiology: Recent Results (from the past 240 hour(s))  Urine culture     Status: None   Collection Time: 09/04/17 12:07 PM  Result Value Ref Range Status   Specimen Description   Final    URINE, CLEAN CATCH Performed at [facility], [address] [place], [place] [address]    Special Requests   Final    NONE Performed at [facility], [address] [place], [place] [address]    Culture   Final    NO GROWTH Performed at [facility], [place] [address] [place], [place] [address]    Report Status 09/05/2017 FINAL  Final  Culture, blood (routine x 2)     Status: None (Preliminary result)   Collection Time: 09/04/17  2:00 PM  Result Value Ref Range Status   Specimen Description   Final    BLOOD RIGHT ANTECUBITAL Performed at [facility], [address] [place], [place]  [address]    Special Requests   Final    BOTTLES DRAWN AEROBIC AND ANAEROBIC Blood Culture results may not be optimal due to an inadequate volume of blood received in culture bottles Performed at [facility], [address] [place], [place] [address]    Culture   Final    NO GROWTH 2 DAYS Performed at [facility], [place] [address] [place], [place] [address]    Report Status PENDING  Incomplete  Culture, blood (routine x 2)     Status: None (Preliminary result)   Collection Time: 09/04/17  2:10 PM  Result Value Ref Range Status   Specimen Description   Final    BLOOD LEFT HAND Performed at [facility], [address] [place], [place] [address]    Special Requests   Final    BOTTLES DRAWN AEROBIC AND ANAEROBIC Blood Culture results may not be optimal due to an inadequate volume of blood received in culture bottles Performed at [facility], [address] [place], [place] [address]    Culture   Final    NO GROWTH 2 DAYS Performed at [facility], [place] [address], [place], [place] [address]    Report Status PENDING  Incomplete  CSF culture     Status: None (Preliminary result)   Collection Time: 09/04/17  4:38 PM  Result Value Ref Range Status   Specimen Description CSF  Final   Special Requests   Final    Normal Performed at [facility], [address] [place], [place] [address]    Gram Stain   Final    WBC PRESENT, PREDOMINANTLY MONONUCLEAR NO ORGANISMS SEEN CYTOSPIN SMEAR    Culture   Final    NO GROWTH 3 DAYS Performed at [facility], [place] [address], [place], [place] [address]    Report Status PENDING  Incomplete    Studies/Results: US Renal  Result Date: 09/06/2017 CLINICAL DATA:  Dysuria EXAM: RENAL / URINARY TRACT ULTRASOUND COMPLETE COMPARISON:  None. FINDINGS: Right Kidney: Length: 13.1 cm. Echogenicity within normal limits. No mass or hydronephrosis visualized. Left Kidney: Length: 12.8 cm.  Echogenicity within normal limits. No mass or hydronephrosis visualized. Bladder: Appears normal for degree of bladder distention. Prominent prostate at 5.2 x 4.6 x 5.8 cm impressing upon the base of the  bladder. IMPRESSION: Enlarged prostate measuring 5.2 x 4.6 x 5.8 cm impressing upon the base of the bladder. Otherwise negative. Electronically Signed   By: Ashley Royalty M.D.   On: 09/06/2017 20:21     Assessment/Plan: FUO Prostate enlargement  Total days of antibiotics: 3 doxy/ceftriaxone  Fever better Moves neck freely CSF and BCx are ngtd.  Will stop ceftriaxone Await RMSF, Ehrlichia, HIV RNA Will add viral CSF studies. CSF infection seems unlikely with his CSF cell count.  Consider checking PSA, uro eval          Bobby Rumpf MD, FACP Infectious Diseases (pager) [phone] [email or web address] 09/07/2017, 12:40 PM  LOS: 3 days

## 2017-09-08 ENCOUNTER — Telehealth: Payer: Self-pay | Admitting: *Deleted

## 2017-09-08 DIAGNOSIS — N400 Enlarged prostate without lower urinary tract symptoms: Secondary | ICD-10-CM

## 2017-09-08 DIAGNOSIS — R6510 Systemic inflammatory response syndrome (SIRS) of non-infectious origin without acute organ dysfunction: Principal | ICD-10-CM

## 2017-09-08 DIAGNOSIS — I951 Orthostatic hypotension: Secondary | ICD-10-CM

## 2017-09-08 LAB — BASIC METABOLIC PANEL WITH GFR
Anion gap: 8 (ref 5–15)
BUN: 7 mg/dL (ref 6–20)
CO2: 26 mmol/L (ref 22–32)
Calcium: 9 mg/dL (ref 8.9–10.3)
Chloride: 105 mmol/L (ref 98–111)
Creatinine, Ser: 0.81 mg/dL (ref 0.61–1.24)
GFR calc Af Amer: >60 mL/min (ref >60)
GFR calc non Af Amer: >60 mL/min (ref >60)
Glucose, Bld: 89 mg/dL (ref 70–99)
Potassium: 4 mmol/L (ref 3.5–5.1)
Sodium: 139 mmol/L (ref 135–145)

## 2017-09-08 LAB — PSA: Prostatic Specific Antigen: 51.42 ng/mL — ABNORMAL HIGH (ref 0.00–4.00)

## 2017-09-08 MED ORDER — DOXYCYCLINE HYCLATE 100 MG PO TABS: 100.0000 mg | ORAL_TABLET | Freq: Two times a day (BID) | ORAL | 0 refills | Status: DC

## 2017-09-08 NOTE — Discharge Summary (Addendum)
Physician Discharge Summary  [place]. DXA:[id number] DOB: 1980/03/16 DOA: 09/04/2017  PCP: Brunetta Jeans, PA-C  Admit date: 09/04/2017 Discharge date: 09/08/2017  Admitted From: Home Disposition:  Home  Recommendations for Outpatient Follow-up:  1. Follow up with PCP in 1-2 weeks 2. Please follow-up with urology as an outpatient follow-up on PSA  Home Health:No Equipment/Devices:None  Discharge Condition:stable CODE STATUS:full Diet recommendation: Heart Healthy   Brief/Interim Summary: 37 y.o. male complaining of fever chills since yesterday and urgency.  In the ED found to be febrile, CT scan of the head negative    Discharge Diagnoses:  Principal Problem:   SIRS (systemic inflammatory response syndrome) (HCC) Active Problems:   Leukocytosis   Orthostatic hypotension   Enlarged prostate Sirs of unclear source, he was started on admission and IV vancomycin and Zosyn. Sepsis was rule out. He had no recent urological or dental procedures. Culture data remain negative HIV and hepatitis panel was negative. CT Angie of the chest showed no PE or infiltrates. Infectious disease was consulted His antibiotic was the escalated to IV Rocephin and doxycycline. Had a lumbar puncture over at [facility] that showed 0 white blood cells. Fluid hydration hurts lightheadedness resolved. Pelvic ultrasound was done that showed an enlarged prostate he was started on Flomax which she will continue as an outpatient. A PSA was ordered and results are pending at the time of discharge he will follow-up with urology. He was sent home on 7 additional days of oral doxycycline which she will continue as an outpatient. 2D echo showed no abnormalities.   Discharge Instructions  Discharge Instructions    Diet - low sodium heart healthy   Complete by:  As directed    Increase activity slowly   Complete by:  As directed      Allergies as of 09/08/2017   No Known Allergies      Medication List    TAKE these medications   acetaminophen 325 MG tablet Commonly known as:  TYLENOL Take 650 mg by mouth every 6 (six) hours as needed for mild pain.   doxycycline 100 MG tablet Commonly known as:  VIBRA-TABS Take 1 tablet (100 mg total) by mouth every 12 (twelve) hours.   Ginkgo Biloba Extract 60 MG Caps Take 60 mg by mouth daily.   ibuprofen 200 MG tablet Commonly known as:  ADVIL,MOTRIN Take 400 mg by mouth every 6 (six) hours as needed for mild pain.   multivitamin tablet Take 1 tablet by mouth daily.      Follow-up Information    Copland, Gay Filler, MD Follow up.   Specialty:  Family Medicine Contact information: [address] [place] [address] [phone]          No Known Allergies  Consultations:  ID   Procedures/Studies: Dg Chest 2 View  Result Date: 09/04/2017 CLINICAL DATA:  Cough, fever, and weakness for 2 days. EXAM: CHEST - 2 VIEW COMPARISON:  None. FINDINGS: The heart size and mediastinal contours are within normal limits. Both lungs are clear. Scarring is seen in the right upper lobe. An asymmetric ill-defined nodular density is seen in the left upper lung on the frontal projection. No evidence of pulmonary infiltrate or edema. No evidence of pleural effusion. IMPRESSION: Ill-defined nodular density in left upper lung. Recommend chest CT without contrast for further evaluation. Right upper lobe scarring. Electronically Signed   By: Earle Gell M.D.   On: 09/04/2017 12:09   Ct Head Wo  Contrast  Result Date: 09/04/2017 CLINICAL DATA:  Two days of headache, fever, chills, weakness, and fatigue. EXAM: CT HEAD WITHOUT CONTRAST TECHNIQUE: Contiguous axial images were obtained from the base of the skull through the vertex without intravenous contrast. COMPARISON:  None. FINDINGS: Brain: There is no evidence of acute infarct, intracranial hemorrhage, mass, midline shift, or extra-axial fluid collection. The ventricles  and sulci are normal. Vascular: No hyperdense vessel. Skull: No fracture or focal osseous lesion. Sinuses/Orbits: Visualized paranasal sinuses and mastoid air cells are clear. Visualized orbits are unremarkable. Other: None. IMPRESSION: Negative head CT. Electronically Signed   By: Logan Bores M.D.   On: 09/04/2017 16:05   Ct Angio Chest Pe W/cm &/or Wo Cm  Result Date: 09/04/2017 CLINICAL DATA:  Nausea, fever, chills weakness and fatigue. Two days of headache. Current chest radiograph demonstrates an ill-defined nodule in the left upper lung. EXAM: CT ANGIOGRAPHY CHEST WITH CONTRAST TECHNIQUE: Multidetector CT imaging of the chest was performed using the standard protocol during bolus administration of intravenous contrast. Multiplanar CT image reconstructions and MIPs were obtained to evaluate the vascular anatomy. CONTRAST:  167m ISOVUE-370 IOPAMIDOL (ISOVUE-370) INJECTION 76% COMPARISON:  Chest radiograph, 09/04/2017 at 12:27 p.m. FINDINGS: Cardiovascular: Opacification of the pulmonary arteries is slightly less than that of the aorta. This somewhat limits assessment of the segmental and subsegmental vessels for pulmonary emboli. Allowing for this mild limitation, there is no evidence of a pulmonary embolism. Heart is normal in size and configuration. No pericardial effusion. Great vessels normal caliber. No aortic dissection or atherosclerosis. Mediastinum/Nodes: No enlarged mediastinal, hilar, or axillary lymph nodes. Thyroid gland, trachea, and esophagus demonstrate no significant findings. Lungs/Pleura: No pulmonary nodule. The faint opacity noted on the current chest radiograph most likely due to superimposed structures. There are mild peribronchovascular reticular type opacities in the anteromedial upper lobes, consistent with scarring. Lungs are otherwise clear. No pleural effusion or pneumothorax. Upper Abdomen: Unremarkable. Musculoskeletal: No chest wall abnormality. No acute or significant  osseous findings. Review of the MIP images confirms the above findings. IMPRESSION: 1. No evidence of a pulmonary embolism. 2. No acute findings. 3. No pulmonary mass or nodule. 4. Mild areas of reticular opacity in the upper lobes consistent with scarring or subsegmental atelectasis. Electronically Signed   By: DLajean ManesM.D.   On: 09/04/2017 15:04   UKoreaRenal  Result Date: 09/06/2017 CLINICAL DATA:  Dysuria EXAM: RENAL / URINARY TRACT ULTRASOUND COMPLETE COMPARISON:  None. FINDINGS: Right Kidney: Length: 13.1 cm. Echogenicity within normal limits. No mass or hydronephrosis visualized. Left Kidney: Length: 12.8 cm. Echogenicity within normal limits. No mass or hydronephrosis visualized. Bladder: Appears normal for degree of bladder distention. Prominent prostate at 5.2 x 4.6 x 5.8 cm impressing upon the base of the bladder. IMPRESSION: Enlarged prostate measuring 5.2 x 4.6 x 5.8 cm impressing upon the base of the bladder. Otherwise negative. Electronically Signed   By: DAshley RoyaltyM.D.   On: 09/06/2017 20:21   Dg Foot Complete Left  Result Date: 09/04/2017 CLINICAL DATA:  Bunion surgery 2016.  Increasing foot pain EXAM: LEFT FOOT - COMPLETE 3+ VIEW COMPARISON:  None. FINDINGS: Prior bunionectomy. Two screws in the first metatarsal in satisfactory position without loosening. Mild degenerative change first metatarsal phalangeal joint. No acute bony abnormality IMPRESSION: Postsurgical changes first metatarsal.  No acute abnormality. Electronically Signed   By: CFranchot GalloM.D.   On: 09/04/2017 14:46     Subjective:  No complains Discharge Exam: Vitals:   09/08/17 0535  09/08/17 0700  BP: 120/68   Pulse: (!) 52   Resp: 16   Temp: (!) 97.5 F (36.4 C)   SpO2: 99% 100%   Vitals:   09/07/17 2025 09/08/17 0500 09/08/17 0535 09/08/17 0700  BP: 116/68  120/68   Pulse: (!) 53  (!) 52   Resp: 18  16   Temp:   (!) 97.5 F (36.4 C)   TempSrc:   Oral   SpO2: 100%  99% 100%  Weight:  74 kg  (163 lb 1.6 oz)    Height:        General: Pt is alert, awake, not in acute distress Cardiovascular: RRR, S1/S2 +, no rubs, no gallops Respiratory: CTA bilaterally, no wheezing, no rhonchi Abdominal: Soft, NT, ND, bowel sounds + Extremities: no edema, no cyanosis    The results of significant diagnostics from this hospitalization (including imaging, microbiology, ancillary and laboratory) are listed below for reference.     Microbiology: Recent Results (from the past 240 hour(s))  Urine culture     Status: None   Collection Time: 09/04/17 12:07 PM  Result Value Ref Range Status   Specimen Description   Final    URINE, CLEAN CATCH Performed at [facility], [address] [place], [place] [address]    Special Requests   Final    NONE Performed at [facility], [address] [place], [place] [address]    Culture   Final    NO GROWTH Performed at [facility], [place] [address] [place], [place] [address]    Report Status 09/05/2017 FINAL  Final  Culture, blood (routine x 2)     Status: None (Preliminary result)   Collection Time: 09/04/17  2:00 PM  Result Value Ref Range Status   Specimen Description   Final    BLOOD RIGHT ANTECUBITAL Performed at [facility], [address] [place], [place] [address]    Special Requests   Final    BOTTLES DRAWN AEROBIC AND ANAEROBIC Blood Culture results may not be optimal due to an inadequate volume of blood received in culture bottles Performed at [facility], [address] [place], [place] [address]    Culture   Final    NO GROWTH 4 DAYS Performed at [facility], [place] [address], [place], [place] [address]    Report Status PENDING  Incomplete  Culture, blood (routine x 2)     Status: None (Preliminary result)   Collection Time: 09/04/17  2:10 PM  Result Value Ref Range Status   Specimen Description   Final    BLOOD LEFT HAND Performed at [facility], [address] [place], [place] [address]    Special Requests   Final    BOTTLES DRAWN AEROBIC AND ANAEROBIC Blood Culture results may not be optimal due to an inadequate volume of blood received in culture bottles Performed at [facility], [address] [place], [place] [address]    Culture   Final    NO GROWTH 4 DAYS Performed at [facility], [place] [address], [place], [place] [address]    Report Status PENDING  Incomplete  CSF culture     Status: None   Collection Time: 09/04/17  4:38 PM  Result Value Ref Range Status   Specimen Description CSF  Final   Special Requests   Final    Normal  Performed at [facility], [address] [place], [place] [address]    Gram Stain   Final    WBC PRESENT, PREDOMINANTLY MONONUCLEAR NO ORGANISMS SEEN CYTOSPIN SMEAR    Culture   Final    NO GROWTH 3 DAYS Performed at [facility], [place] [address] [place], [place] [address]    Report Status 09/07/2017 FINAL  Final     Labs: BNP (last 3 results) No results for input(s): BNP in the last 8760 hours. Basic Metabolic Panel: Recent Labs  Lab 09/04/17 1243 09/05/17 0637 09/07/17 0653 09/08/17 0651  NA 137 137 141 139  K 3.8 3.0* 4.5 4.0  CL 101 104 107 105  CO2 26 25 27 26   GLUCOSE 124* 136* 99 89  BUN 11 11 9 7   CREATININE 1.09 1.17 0.89 0.81  CALCIUM 9.1 8.2* 8.9 9.0   Liver Function Tests: Recent Labs  Lab 09/04/17 1243 09/05/17 0637  AST 27 17  ALT 23 17  ALKPHOS 49 41  BILITOT 0.7 0.9  PROT 7.9 6.6  ALBUMIN 4.3 3.3*   No results for input(s): LIPASE, AMYLASE in the last 168 hours. No results for input(s): AMMONIA in the last 168 hours. CBC: Recent Labs  Lab 09/04/17 1243 09/05/17 0637 09/06/17 1100 09/07/17 0653  WBC 23.2* 26.5* 18.6* 8.3  NEUTROABS 19.5*  --  15.1* 5.1  HGB 14.8 13.0 13.4 12.9*  HCT 43.3 39.2 40.6 39.0  MCV 78.3 79.8 80.2 80.2  PLT 344 293 281 340   Cardiac Enzymes: Recent Labs  Lab 09/04/17 1243   TROPONINI <0.03   BNP: Invalid input(s): POCBNP CBG: No results for input(s): GLUCAP in the last 168 hours. D-Dimer No results for input(s): DDIMER in the last 72 hours. Hgb A1c No results for input(s): HGBA1C in the last 72 hours. Lipid Profile No results for input(s): CHOL, HDL, LDLCALC, TRIG, CHOLHDL, LDLDIRECT in the last 72 hours. Thyroid function studies No results for input(s): TSH, T4TOTAL, T3FREE, THYROIDAB in the last 72 hours.  Invalid input(s): FREET3 Anemia work up No results for input(s): VITAMINB12, FOLATE, FERRITIN, TIBC, IRON, RETICCTPCT in the last 72 hours. Urinalysis    Component Value Date/Time   COLORURINE YELLOW 09/04/2017 1207   APPEARANCEUR CLOUDY (A) 09/04/2017 1207   LABSPEC 1.020 09/04/2017 1207   PHURINE 6.0 09/04/2017 [place] 09/04/2017 1207   GLUCOSEU NEGATIVE 10/14/2014 1006   HGBUR NEGATIVE 09/04/2017 1207   BILIRUBINUR NEGATIVE 09/04/2017 1207   KETONESUR 15 (A) 09/04/2017 1207   PROTEINUR 30 (A) 09/04/2017 1207   UROBILINOGEN 0.2 10/14/2014 1006   NITRITE NEGATIVE 09/04/2017 1207   LEUKOCYTESUR NEGATIVE 09/04/2017 1207   Sepsis Labs Invalid input(s): PROCALCITONIN,  WBC,  LACTICIDVEN Microbiology Recent Results (from the past 240 hour(s))  Urine culture     Status: None   Collection Time: 09/04/17 12:07 PM  Result Value Ref Range Status   Specimen Description   Final    URINE, CLEAN CATCH Performed at [facility], [address] [place], [place] [address]    Special Requests   Final    NONE Performed at [facility], [address] [place], [place] [address]    Culture   Final    NO GROWTH Performed at [facility], [place] [address], [place],  [address]    Report Status 09/05/2017 FINAL  Final  Culture, blood (routine x 2)     Status: None (  Preliminary result)   Collection Time: 09/04/17  2:00 PM  Result Value Ref Range Status   Specimen Description   Final    BLOOD  RIGHT ANTECUBITAL Performed at [facility] [place], [address] [place], [place] [address]    Special Requests   Final    BOTTLES DRAWN AEROBIC AND ANAEROBIC Blood Culture results may not be optimal due to an inadequate volume of blood received in culture bottles Performed at [facility], [address] [place], [place] [address]    Culture   Final    NO GROWTH 4 DAYS Performed at [facility], [place] [address], [place], [place] [address]    Report Status PENDING  Incomplete  Culture, blood (routine x 2)     Status: None (Preliminary result)   Collection Time: 09/04/17  2:10 PM  Result Value Ref Range Status   Specimen Description   Final    BLOOD LEFT HAND Performed at [facility], [address] [place], [place] [address]    Special Requests   Final    BOTTLES DRAWN AEROBIC AND ANAEROBIC Blood Culture results may not be optimal due to an inadequate volume of blood received in culture bottles Performed at [facility], [address] [place], [place] [address]    Culture   Final    NO GROWTH 4 DAYS Performed at [facility], [place] [address], [place], [place] [address]    Report Status PENDING  Incomplete  CSF culture     Status: None   Collection Time: 09/04/17  4:38 PM  Result Value Ref Range Status   Specimen Description CSF  Final   Special Requests   Final    Normal Performed at [facility], [address] [place], [place] [address]    Gram Stain   Final    WBC PRESENT, PREDOMINANTLY MONONUCLEAR NO ORGANISMS SEEN CYTOSPIN SMEAR    Culture   Final    NO GROWTH 3 DAYS Performed at [facility], [place] [address], [place], [place] [address]    Report Status 09/07/2017 FINAL  Final     Time coordinating discharge: 40 min  SIGNED:   Charlynne Cousins, MD  [facility] 09/08/2017, 9:36 AM Pager   If 7PM-7AM, please contact night-coverage [email or web address] Password [email or web address]

## 2017-09-08 NOTE — Progress Notes (Signed)
Discharge instructions and medications discussed with patient.  AVS and prescription given to patient.  All questions answered.

## 2017-09-08 NOTE — Telephone Encounter (Signed)
Make sure he is resting in a cool environment and try to keep him well hydrated. If symptoms are severe, he needs to return to the ER.

## 2017-09-08 NOTE — Telephone Encounter (Signed)
Called to speak with wife (okay per DPR).   Patient was in the hospital for the last 5 days.   They discharged him today and while he was leaving the hospital he started throwing up.  He has thrown up twice since discharge and states that his head is hurting really bad.  He is not currently running a fever.   He said he is hot, dizzy and nauseated.   His wife game him a dramamine, and this has helped with the nausea.   She has made an appointment for him to follow-up on Tuesday - but they are asking if there is anything that can be done for him through the weekend as far as the pain/nausea.   Advised that I will route this to Elyn Aquas and let them know any suggestions that he has.     Copied from [place] [phone]. Topic: General - Other >> Sep 08, 2017  3:09 PM Keene Breath wrote: Reason for CRM: Patient's wife called to speak to a nurse because patient has been vomiting and has a severe headache since leaving the hospital today.  Please advise.  CB# [phone] or [phone].

## 2017-09-08 NOTE — Telephone Encounter (Signed)
Spoke with wife to give her PCP recommendations.   Stated verbal understanding.

## 2017-09-09 LAB — HERPES SIMPLEX VIRUS(HSV) DNA BY PCR
HSV 1 DNA: NEGATIVE
HSV 2 DNA: NEGATIVE

## 2017-09-09 LAB — CULTURE, BLOOD (ROUTINE X 2)
CULTURE: NO GROWTH
Culture: NO GROWTH

## 2017-09-11 LAB — ENTEROVIRUS PCR: Enterovirus PCR: NEGATIVE

## 2017-09-12 ENCOUNTER — Encounter: Payer: Self-pay | Admitting: Physician Assistant

## 2017-09-12 ENCOUNTER — Ambulatory Visit: Payer: BLUE CROSS/BLUE SHIELD | Admitting: Physician Assistant

## 2017-09-12 ENCOUNTER — Telehealth: Payer: Self-pay | Admitting: General Practice

## 2017-09-12 ENCOUNTER — Other Ambulatory Visit: Payer: Self-pay

## 2017-09-12 ENCOUNTER — Other Ambulatory Visit (HOSPITAL_COMMUNITY)
Admission: RE | Admit: 2017-09-12 | Discharge: 2017-09-12 | Disposition: A | Discharge status: Home / Self Care | Payer: BLUE CROSS/BLUE SHIELD | Source: Ambulatory Visit | Attending: Physician Assistant | Admitting: Physician Assistant

## 2017-09-12 VITALS — BP 112/72 | HR 52 | Temp 98.0°F | Resp 15 | Ht 70.0 in | Wt 162.6 lb

## 2017-09-12 DIAGNOSIS — G971 Other reaction to spinal and lumbar puncture: Secondary | ICD-10-CM

## 2017-09-12 DIAGNOSIS — R6510 Systemic inflammatory response syndrome (SIRS) of non-infectious origin without acute organ dysfunction: Secondary | ICD-10-CM

## 2017-09-12 DIAGNOSIS — R972 Elevated prostate specific antigen [PSA]: Secondary | ICD-10-CM | POA: Insufficient documentation

## 2017-09-12 DIAGNOSIS — R945 Abnormal results of liver function studies: Principal | ICD-10-CM

## 2017-09-12 DIAGNOSIS — R7989 Other specified abnormal findings of blood chemistry: Secondary | ICD-10-CM

## 2017-09-12 LAB — CBC WITH DIFFERENTIAL/PLATELET
BASOS ABS: 0.1 10*3/uL (ref 0.0–0.1)
Basophils Relative: 1.1 % (ref 0.0–3.0)
EOS ABS: 0.2 10*3/uL (ref 0.0–0.7)
Eosinophils Relative: 3.2 % (ref 0.0–5.0)
HEMATOCRIT: 40.9 % (ref 39.0–52.0)
HEMOGLOBIN: 13.5 g/dL (ref 13.0–17.0)
LYMPHS PCT: 35.4 % (ref 12.0–46.0)
Lymphs Abs: 2.5 10*3/uL (ref 0.7–4.0)
MCHC: 33.1 g/dL (ref 30.0–36.0)
MCV: 79.9 fl (ref 78.0–100.0)
MONO ABS: 0.7 10*3/uL (ref 0.1–1.0)
Monocytes Relative: 10.3 % (ref 3.0–12.0)
Neutro Abs: 3.5 10*3/uL (ref 1.4–7.7)
Neutrophils Relative %: 50 % (ref 43.0–77.0)
Platelets: 488 10*3/uL — ABNORMAL HIGH (ref 150.0–400.0)
RBC: 5.12 Mil/uL (ref 4.22–5.81)
RDW: 14.3 % (ref 11.5–15.5)
WBC: 7.1 10*3/uL (ref 4.0–10.5)

## 2017-09-12 LAB — COMPREHENSIVE METABOLIC PANEL
ALBUMIN: 4.2 g/dL (ref 3.5–5.2)
ALT: 63 U/L — AB (ref 0–53)
AST: 27 U/L (ref 0–37)
Alkaline Phosphatase: 44 U/L (ref 39–117)
BILIRUBIN TOTAL: 0.4 mg/dL (ref 0.2–1.2)
BUN: 12 mg/dL (ref 6–23)
CALCIUM: 9.8 mg/dL (ref 8.4–10.5)
CHLORIDE: 100 meq/L (ref 96–112)
CO2: 33 mEq/L — ABNORMAL HIGH (ref 19–32)
Creatinine, Ser: 1.01 mg/dL (ref 0.40–1.50)
GFR: 106.74 mL/min (ref >60.00)
Glucose, Bld: 89 mg/dL (ref 70–99)
Potassium: 4.2 mEq/L (ref 3.5–5.1)
SODIUM: 140 meq/L (ref 135–145)
Total Protein: 7.3 g/dL (ref 6.0–8.3)

## 2017-09-12 MED ORDER — KETOROLAC TROMETHAMINE 60 MG/2ML IM SOLN
60.0000 mg | Freq: Once | INTRAMUSCULAR | Status: AC
  Administered 2017-09-12: 60 mg via INTRAMUSCULAR

## 2017-09-12 NOTE — Telephone Encounter (Signed)
FMLA paperwork given to the front desk after appointment with PCP today. Paperwork was walked back and handed to PCP for review and completion.

## 2017-09-12 NOTE — Telephone Encounter (Signed)
Forms completed and given to CMA to fax.

## 2017-09-12 NOTE — Telephone Encounter (Signed)
Paperwork faxed to the provided number. Documents scanned into chart

## 2017-09-12 NOTE — Patient Instructions (Signed)
Please go to the lab today for blood work.  I will call you with your results. We will alter treatment regimen(s) if indicated by your results.   Please keep well hydrated and get plenty of rest. If you symptoms of headache are not resolving within the next 24 hours, you need to be seen in the ER.  Please follow-up with Urology as scheduled Thursday morning.

## 2017-09-12 NOTE — Progress Notes (Signed)
Patient presents to clinic today for hospital follow-up. Patient presented to the ER on 09/04/17 after being seen at the [facility] office with c/o fever and headaches. ER workup included CT Head (Negative), CTA Chest (Negative for acute finding), CBC (WBC at 23.2) and elevated lactic acid (2.85). Patient was diagnosed with SIRS. Patient was given IV Vancomycin, IV Rocephin and Doxycycline. Was admitted to the hospital for further assessment. Sepsis was ruled out. Cultures remained negative during hospitalization. Lumbar puncture negative. Pelvic US revealed enlarged prostate. PSA obtained and pending on day of discharge. Urine Gc/Chlamydia ordered but never obtained during admission. Patient discharged home on Doxycycline to follow-up with PCP and Urology.  Since discharge patient endorses taking antibiotic as directed. Is tolerating well. Denies fever, chills, N/V or urinary symptoms. Has noted daily headache since lumbar puncture. Notes some photophobia and phonophobia. Has not taken anything for headache today. Patient has Urology follow-up scheduled later this week.   Past Medical History:  Diagnosis Date  . Hallux valgus due to metatarsus primus varus 12/2014   right  . Hx of migraine headaches     Current Outpatient Medications on File Prior to Visit  Medication Sig Dispense Refill  . acetaminophen (TYLENOL) 325 MG tablet Take 650 mg by mouth every 6 (six) hours as needed for mild pain.    Marland Kitchen doxycycline (VIBRA-TABS) 100 MG tablet Take 1 tablet (100 mg total) by mouth every 12 (twelve) hours. 16 tablet 0  . ibuprofen (ADVIL,MOTRIN) 200 MG tablet Take 400 mg by mouth every 6 (six) hours as needed for mild pain.    . Multiple Vitamin (MULTIVITAMIN) tablet Take 1 tablet by mouth daily.    . Ginkgo Biloba Extract 60 MG CAPS Take 60 mg by mouth daily.     No current facility-administered medications on file prior to visit.     No Known Allergies  Family History  Problem Relation Age of Onset  .  Hypertension Father   . Stroke Paternal Grandfather   . Cancer Neg Hx     Social History   Socioeconomic History  . Marital status: Married    Spouse name: Not on file  . Number of children: 1  . Years of education: Not on file  . Highest education level: Not on file  Occupational History    Employer: [employer]  Social Needs  . Financial resource strain: Not on file  . Food insecurity:    Worry: Not on file    Inability: Not on file  . Transportation needs:    Medical: Not on file    Non-medical: Not on file  Tobacco Use  . Smoking status: Never Smoker  . Smokeless tobacco: Never Used  Substance and Sexual Activity  . Alcohol use: No  . Drug use: No  . Sexual activity: Yes  Lifestyle  . Physical activity:    Days per week: Not on file    Minutes per session: Not on file  . Stress: Not on file  Relationships  . Social connections:    Talks on phone: Not on file    Gets together: Not on file    Attends religious service: Not on file    Active member of club or organization: Not on file    Attends meetings of clubs or organizations: Not on file    Relationship status: Not on file  Other Topics Concern  . Not on file  Social History Narrative   Caffeine use:  1 soda every other day  Regular exercise:  4-5 x weekly   Married, 1 daughter born in 2010   Works at [employer].    Student at [employer] - See HPI.  All other ROS are negative.  BP 112/72   Pulse (!) 52   Temp 98 F (36.7 C) (Oral)   Resp 15   Ht _0  (1.778 m)   Wt 162 lb 9.6 oz (73.8 kg)   SpO2 99%   BMI 23.33 kg/m   Physical Exam  Constitutional: He is oriented to person, place, and time. He appears well-developed and well-nourished.  HENT:  Head: Normocephalic and atraumatic.  Right Ear: External ear normal.  Left Ear: External ear normal.  Mouth/Throat: Oropharynx is clear and moist.  Eyes: Conjunctivae are normal.    Cardiovascular: Normal rate, regular rhythm, normal heart sounds and intact distal pulses.  Pulmonary/Chest: Effort normal and breath sounds normal. No stridor. No respiratory distress. He has no wheezes. He has no rales. He exhibits no tenderness.  Neurological: He is alert and oriented to person, place, and time. No cranial nerve deficit.  Psychiatric: He has a normal mood and affect.  Vitals reviewed.   Recent Results (from the past 2160 hour(s))  Urinalysis, Routine w reflex microscopic     Status: Abnormal   Collection Time: 09/04/17 12:07 PM  Result Value Ref Range   Color, Urine YELLOW YELLOW   APPearance CLOUDY (A) CLEAR   Specific Gravity, Urine 1.020 1.005 - 1.030   pH 6.0 5.0 - 8.0   Glucose, UA NEGATIVE NEGATIVE mg/dL   Hgb urine dipstick NEGATIVE NEGATIVE   Bilirubin Urine NEGATIVE NEGATIVE   Ketones, ur 15 (A) NEGATIVE mg/dL   Protein, ur 30 (A) NEGATIVE mg/dL   Nitrite NEGATIVE NEGATIVE   Leukocytes, UA NEGATIVE NEGATIVE    Comment: Performed at [facility], [address] [place], [place] [address]  Urinalysis, Microscopic (reflex)     Status: Abnormal   Collection Time: 09/04/17 12:07 PM  Result Value Ref Range   RBC / HPF 0-5 0 - 5 RBC/hpf   WBC, UA 6-10 0 - 5 WBC/hpf   Bacteria, UA MANY (A) NONE SEEN   Squamous Epithelial / LPF 0-5 0 - 5   Mucus PRESENT     Comment: Performed at [facility], [address] [place], [place] [address]  Urine culture     Status: None   Collection Time: 09/04/17 12:07 PM  Result Value Ref Range   Specimen Description      URINE, CLEAN CATCH Performed at [facility], [address] [place], [place] [address]    Special Requests      NONE Performed at [facility], [address] [place], [place] [address]    Culture      NO GROWTH Performed at [facility], [place] [address], [place], [place] [address]    Report Status 09/05/2017 FINAL   Comprehensive metabolic panel      Status: Abnormal   Collection Time: 09/04/17 12:43 PM  Result Value Ref Range   Sodium 137 135 - 145 mmol/L   Potassium 3.8 3.5 - 5.1 mmol/L   Chloride 101 98 - 111 mmol/L    Comment: Please note change in reference range.   CO2 26 22 - 32 mmol/L   Glucose, Bld 124 (  H) 70 - 99 mg/dL    Comment: Please note change in reference range.   BUN 11 6 - 20 mg/dL    Comment: Please note change in reference range.   Creatinine, Ser 1.09 0.61 - 1.24 mg/dL   Calcium 9.1 8.9 - 10.3 mg/dL   Total Protein 7.9 6.5 - 8.1 g/dL   Albumin 4.3 3.5 - 5.0 g/dL   AST 27 15 - 41 U/L   ALT 23 0 - 44 U/L    Comment: Please note change in reference range.   Alkaline Phosphatase 49 38 - 126 U/L   Total Bilirubin 0.7 0.3 - 1.2 mg/dL   GFR calc non Af Amer >60 >60 mL/min   GFR calc Af Amer >60 >60 mL/min    Comment: (NOTE) The eGFR has been calculated using the CKD EPI equation. This calculation has not been validated in all clinical situations. eGFR's persistently <60 mL/min signify possible Chronic Kidney Disease.    Anion gap 10 5 - 15    Comment: Performed at [facility], [address] [place], [place] [address]  CBC with Differential     Status: Abnormal   Collection Time: 09/04/17 12:43 PM  Result Value Ref Range   WBC 23.2 (H) 4.0 - 10.5 K/uL   RBC 5.53 4.22 - 5.81 MIL/uL   Hemoglobin 14.8 13.0 - 17.0 g/dL   HCT 43.3 39.0 - 52.0 %   MCV 78.3 78.0 - 100.0 fL   MCH 26.8 26.0 - 34.0 pg   MCHC 34.2 30.0 - 36.0 g/dL   RDW 13.3 11.5 - 15.5 %   Platelets 344 150 - 400 K/uL   Neutrophils Relative % 84 %   Lymphocytes Relative 6 %   Monocytes Relative 10 %   Eosinophils Relative 0 %   Basophils Relative 0 %   Band Neutrophils 0 %   Metamyelocytes Relative 0 %   Myelocytes 0 %   Promyelocytes Relative 0 %   Blasts 0 %   nRBC 0 0 /100 WBC   Neutro Abs 19.5 (H) 1.7 - 7.7 K/uL   Lymphs Abs 1.4 0.7 - 4.0 K/uL   Monocytes Absolute 2.3 (H) 0.1 - 1.0 K/uL   Eosinophils Absolute 0.0  0.0 - 0.7 K/uL   Basophils Absolute 0.0 0.0 - 0.1 K/uL   WBC Morphology TOXIC GRANULATION     Comment: Performed at [facility], [address] [place], [place] [address]  Troponin I     Status: None   Collection Time: 09/04/17 12:43 PM  Result Value Ref Range   Troponin I <0.03 <0.03 ng/mL    Comment: Performed at [facility], [address] [place], [place] [address]  I-Stat CG4 Lactic Acid, ED     Status: Abnormal   Collection Time: 09/04/17 12:54 PM  Result Value Ref Range   Lactic Acid, Venous 2.85 (HH) 0.5 - 1.9 mmol/L   Comment NOTIFIED PHYSICIAN   Culture, blood (routine x 2)     Status: None   Collection Time: 09/04/17  2:00 PM  Result Value Ref Range   Specimen Description      BLOOD RIGHT ANTECUBITAL Performed at [facility], [address] [place], [place] [address]    Special Requests      BOTTLES DRAWN AEROBIC AND ANAEROBIC Blood Culture results may not be optimal due to an inadequate volume of blood received in culture bottles Performed at [place]  [address], [address] [place], [place] [address]    Culture      NO GROWTH 5 DAYS Performed at [facility], [place] [address], [place], [place] [address]    Report Status 09/09/2017 FINAL   Culture, blood (routine x 2)     Status: None   Collection Time: 09/04/17  2:10 PM  Result Value Ref Range   Specimen Description      BLOOD LEFT HAND Performed at [facility], [address] [place], [place] [address]    Special Requests      BOTTLES DRAWN AEROBIC AND ANAEROBIC Blood Culture results may not be optimal due to an inadequate volume of blood received in culture bottles Performed at [facility], [address] [place], [place] [address]    Culture      NO GROWTH 5 DAYS Performed at [facility], [place] [address], [place], [place] [address]    Report Status 09/09/2017 FINAL   I-Stat CG4 Lactic Acid, ED     Status: Abnormal    Collection Time: 09/04/17  3:06 PM  Result Value Ref Range   Lactic Acid, Venous 3.08 (HH) 0.5 - 1.9 mmol/L   Comment NOTIFIED PHYSICIAN   CSF cell count with differential collection tube #: 1     Status: Abnormal   Collection Time: 09/04/17  4:38 PM  Result Value Ref Range   Tube # 1     Comment: Performed at [facility], [address] [place], [place] [address]   Color, CSF COLORLESS COLORLESS   Appearance, CSF CLEAR CLEAR   Supernatant NOT INDICATED    RBC Count, CSF 1 (H) 0 /cu mm   WBC, CSF 0 0 - 5 /cu mm   Other Cells, CSF TOO FEW TO COUNT, SMEAR AVAILABLE FOR REVIEW     Comment: Rare lymphs. Performed at [facility], [place] [address], [place], [place] [address]   CSF cell count with differential collection tube #: 4     Status: None   Collection Time: 09/04/17  4:38 PM  Result Value Ref Range   Tube # 4     Comment: Performed at [facility], [address] [place], [place] [address]   Color, CSF COLORLESS COLORLESS   Appearance, CSF CLEAR CLEAR   Supernatant NOT INDICATED    RBC Count, CSF 0 0 /cu mm   WBC, CSF 0 0 - 5 /cu mm   Other Cells, CSF TOO FEW TO COUNT, SMEAR AVAILABLE FOR REVIEW     Comment: Rare lymphs. Performed at [facility], [place] [address] [place], [place] [address]   CSF culture     Status: None   Collection Time: 09/04/17  4:38 PM  Result Value Ref Range   Specimen Description CSF    Special Requests      Normal Performed at [facility], [address] [place], [place] [address]    Gram Stain      WBC PRESENT, PREDOMINANTLY MONONUCLEAR NO ORGANISMS SEEN CYTOSPIN SMEAR    Culture      NO GROWTH 3 DAYS Performed at [facility], [place] [address] [place], [place] [address]    Report Status 09/07/2017 FINAL   Protein and glucose, CSF     Status: Abnormal   Collection Time: 09/04/17  4:38 PM  Result Value Ref Range   Glucose,  CSF 66 40 - 70 mg/dL   Total  Protein, CSF 56 (H) 15 - 45  mg/dL    Comment: Performed at [facility], [address] [place], [place] [address]  Enterovirus pcr     Status: None   Collection Time: 09/04/17  4:38 PM  Result Value Ref Range   Enterovirus PCR Negative Negative    Comment: (NOTE) No Enteroviral RNA Detected. This test was developed and its performance characteristics determined by [facility].  It has not been cleared or approved by the Food and Drug Administration.  The FDA has determined that such clearance or approval is not necessary. Performed At: [facility] [place], [place] [id number] Rush Farmer MD XA:[id number]   I-Stat CG4 Lactic Acid, ED     Status: Abnormal   Collection Time: 09/04/17  6:42 PM  Result Value Ref Range   Lactic Acid, Venous 2.21 (HH) 0.5 - 1.9 mmol/L   Comment MD NOTIFIED, REPEAT TEST   Rocky mtn spotted fvr abs pnl(IgG+IgM)     Status: Abnormal   Collection Time: 09/04/17  7:07 PM  Result Value Ref Range   RMSF IgG Positive (A) Negative   RMSF IgM 0.22 0.00 - 0.89 index    Comment: (NOTE)                                 Negative        <0.90                                 Equivocal 0.90 - 1.10                                 Positive        >1.10 Performed At: [facility] [place], [place] [id number] Rush Farmer MD ZM:[id number]   RMSF, IgG, IFA     Status: Abnormal   Collection Time: 09/04/17  7:07 PM  Result Value Ref Range   RMSF, IGG, IFA 1:64 (H) Neg <1:64    Comment: (NOTE)                             Negative           <1:64                             Positive            1:64                             Recent/Active      >1:64 Titers of 1:64 are suggestive of past or possible current infection. Titers >1:64 are suggestive of recent or active infection. Approximately 9% of specimens positive by EIA screen are negative by IFA. Performed At: [facility] [place], [place] [id number] Rush Farmer MD EX:[id number]   Sedimentation rate     Status: None   Collection Time: 09/05/17  6:37 AM  Result Value Ref Range   Sed Rate 8 0 - 16 mm/hr    Comment: Performed at [facility], [address]  [place], [place] [address]  HIV antibody (Routine Testing)     Status: None   Collection Time: 09/05/17  6:37 AM  Result Value Ref Range   HIV Screen 4th Generation wRfx Non Reactive Non Reactive    Comment: (NOTE) Performed At: [facility] [place], [place] [id number] Rush Farmer MD WL:[id number]   Comprehensive metabolic panel     Status: Abnormal   Collection Time: 09/05/17  6:37 AM  Result Value Ref Range   Sodium 137 135 - 145 mmol/L   Potassium 3.0 (L) 3.5 - 5.1 mmol/L   Chloride 104 98 - 111 mmol/L    Comment: Please note change in reference range.   CO2 25 22 - 32 mmol/L   Glucose, Bld 136 (H) 70 - 99 mg/dL    Comment: Please note change in reference range.   BUN 11 6 - 20 mg/dL    Comment: Please note change in reference range.   Creatinine, Ser 1.17 0.61 - 1.24 mg/dL   Calcium 8.2 (L) 8.9 - 10.3 mg/dL   Total Protein 6.6 6.5 - 8.1 g/dL   Albumin 3.3 (L) 3.5 - 5.0 g/dL   AST 17 15 - 41 U/L   ALT 17 0 - 44 U/L    Comment: Please note change in reference range.   Alkaline Phosphatase 41 38 - 126 U/L   Total Bilirubin 0.9 0.3 - 1.2 mg/dL   GFR calc non Af Amer >60 >60 mL/min   GFR calc Af Amer >60 >60 mL/min    Comment: (NOTE) The eGFR has been calculated using the CKD EPI equation. This calculation has not been validated in all clinical situations. eGFR's persistently <60 mL/min signify possible Chronic Kidney Disease.    Anion gap 8 5 - 15    Comment: Performed at [facility], [place] [address] [place], [place] [address]  CBC     Status: Abnormal   Collection Time: 09/05/17  6:37 AM  Result Value Ref Range   WBC 26.5 (H) 4.0 - 10.5 K/uL   RBC 4.91 4.22 - 5.81 MIL/uL   Hemoglobin 13.0 13.0 - 17.0  g/dL   HCT 39.2 39.0 - 52.0 %   MCV 79.8 78.0 - 100.0 fL   MCH 26.5 26.0 - 34.0 pg   MCHC 33.2 30.0 - 36.0 g/dL   RDW 13.5 11.5 - 15.5 %   Platelets 293 150 - 400 K/uL    Comment: Performed at [facility], [place] [address], [place], [place] [address]  Urine rapid drug screen (hosp performed)     Status: Abnormal   Collection Time: 09/05/17  9:48 AM  Result Value Ref Range   Opiates NONE DETECTED NONE DETECTED   Cocaine NONE DETECTED NONE DETECTED   Benzodiazepines POSITIVE (A) NONE DETECTED   Amphetamines NONE DETECTED NONE DETECTED   Tetrahydrocannabinol NONE DETECTED NONE DETECTED   Barbiturates (A) NONE DETECTED    Result not available. Reagent lot number recalled by manufacturer.    Comment: Performed at [facility], [place] [address], [place], [place] [address]  Hepatitis panel, acute     Status: None   Collection Time: 09/05/17 10:22 AM  Result Value Ref Range   Hepatitis B Surface Ag Negative Negative   HCV Ab <0.1 0.0 - 0.9 s/co ratio    Comment: (NOTE)  Negative:     < 0.8                             Indeterminate: 0.8 - 0.9                                  Positive:     > 0.9 The CDC recommends that a positive HCV antibody result be followed up with a HCV Nucleic Acid Amplification test (353614). Performed At: [facility] [place], [place] [id number] Rush Farmer MD PY:[id number]    Hep A IgM Negative Negative   Hep B C IgM Negative Negative  ECHOCARDIOGRAM COMPLETE     Status: None   Collection Time: 09/05/17 11:48 AM  Result Value Ref Range   Weight 2,664 oz   Height 70 in   BP 102/51 mmHg  CBC with Differential/Platelet     Status: Abnormal   Collection Time: 09/06/17 11:00 AM  Result Value Ref Range   WBC 18.6 (H) 4.0 - 10.5 K/uL   RBC 5.06 4.22 - 5.81 MIL/uL   Hemoglobin 13.4 13.0 - 17.0 g/dL   HCT 40.6 39.0 - 52.0 %   MCV 80.2 78.0 - 100.0 fL   MCH 26.5 26.0 -  34.0 pg   MCHC 33.0 30.0 - 36.0 g/dL   RDW 13.8 11.5 - 15.5 %   Platelets 281 150 - 400 K/uL   Neutrophils Relative % 81 %   Lymphocytes Relative 12 %   Monocytes Relative 6 %   Eosinophils Relative 1 %   Basophils Relative 0 %   Neutro Abs 15.1 (H) 1.7 - 7.7 K/uL   Lymphs Abs 2.2 0.7 - 4.0 K/uL   Monocytes Absolute 1.1 (H) 0.1 - 1.0 K/uL   Eosinophils Absolute 0.2 0.0 - 0.7 K/uL   Basophils Absolute 0.0 0.0 - 0.1 K/uL    Comment: Performed at [facility], [place] [address] [place], [place] [address]  HIV 1 RNA quant-no reflex-bld     Status: None   Collection Time: 09/06/17  2:55 PM  Result Value Ref Range   HIV 1 RNA Quant <20 copies/mL    Comment: (NOTE) HIV-1 RNA not detected The reportable range for this assay is 20 to 10,000,000 copies HIV-1 RNA/mL. Performed At: [facility] [place], [place] [id number] Rush Farmer MD NL:[id number]    LOG10 HIV-1 RNA UNABLE TO CALCULATE log10copy/mL    Comment: (NOTE) Unable to calculate result since non-numeric result obtained for component test.   Ehrlichia antibody panel     Status: None   Collection Time: 09/06/17  2:55 PM  Result Value Ref Range   E chaffeensis (HGE) Ab, IgG Negative Neg:<1:64    Comment: (NOTE) HGE IgG levels are detectable 7 to 10 days post infection and persist approximately one year.    E chaffeensis (HGE) Ab, IgM Negative Neg:<1:20    Comment: (NOTE) Due to a reagent backorder, this test was performed using a different assay. The reference interval for this alternate assay is:                                       Negative      <1:64  Positive       1:64 or greater IgM levels usually rise 3 to 5 days post infection and fall to normal levels in approximately 30 to 60 days. Performed At: [facility] [place], [place] [id number] Rush Farmer MD BS:[id number]    E.Chaffeensis (HME) IgG Negative  Neg:<1:64   E. Chaffeensis (HME) IgM Titer Negative Neg:<1:20    Comment: (NOTE) IgG titers if 1:64 or greater indicate exposure or  acute and convalescent samples showing a four-fold increase, and/or the presence of IgM indicate recent or current infection.   RPR     Status: None   Collection Time: 09/06/17  2:55 PM  Result Value Ref Range   RPR Ser Ql Non Reactive Non Reactive    Comment: (NOTE) Performed At: [facility] [place], [place] [id number] Rush Farmer MD WS:[id number]   Basic metabolic panel     Status: None   Collection Time: 09/07/17  6:53 AM  Result Value Ref Range   Sodium 141 135 - 145 mmol/L   Potassium 4.5 3.5 - 5.1 mmol/L   Chloride 107 98 - 111 mmol/L    Comment: Please note change in reference range.   CO2 27 22 - 32 mmol/L   Glucose, Bld 99 70 - 99 mg/dL    Comment: Please note change in reference range.   BUN 9 6 - 20 mg/dL    Comment: Please note change in reference range.   Creatinine, Ser 0.89 0.61 - 1.24 mg/dL   Calcium 8.9 8.9 - 10.3 mg/dL   GFR calc non Af Amer >60 >60 mL/min   GFR calc Af Amer >60 >60 mL/min    Comment: (NOTE) The eGFR has been calculated using the CKD EPI equation. This calculation has not been validated in all clinical situations. eGFR's persistently <60 mL/min signify possible Chronic Kidney Disease.    Anion gap 7 5 - 15    Comment: Performed at [facility], [place] [address] [place], [place] [address]  CBC with Differential/Platelet     Status: Abnormal   Collection Time: 09/07/17  6:53 AM  Result Value Ref Range   WBC 8.3 4.0 - 10.5 K/uL   RBC 4.86 4.22 - 5.81 MIL/uL   Hemoglobin 12.9 (L) 13.0 - 17.0 g/dL   HCT 39.0 39.0 - 52.0 %   MCV 80.2 78.0 - 100.0 fL   MCH 26.5 26.0 - 34.0 pg   MCHC 33.1 30.0 - 36.0 g/dL   RDW 13.8 11.5 - 15.5 %   Platelets 340 150 - 400 K/uL   Neutrophils Relative % 62 %   Neutro Abs 5.1 1.7 - 7.7 K/uL   Lymphocytes Relative 24 %   Lymphs Abs  2.0 0.7 - 4.0 K/uL   Monocytes Relative 11 %   Monocytes Absolute 0.9 0.1 - 1.0 K/uL   Eosinophils Relative 3 %   Eosinophils Absolute 0.3 0.0 - 0.7 K/uL   Basophils Relative 0 %   Basophils Absolute 0.0 0.0 - 0.1 K/uL    Comment: Performed at [facility], [place] [address] [place], [place] [address]  Herpes simplex virus (HSV), DNA by PCR Cerebrospinal Fluid     Status: None   Collection Time: 09/07/17  1:30 PM  Result Value Ref Range   Specimen source hsv BLOOD     Comment: Performed at [facility], [place] [address], [place], [place] [address]   HSV 1 DNA Negative Negative   HSV  2 DNA Negative Negative    Comment: (NOTE) This test was developed and its performance characteristics determined by [employer]. It has not been cleared or approved by the U.S. Food and Drug Administration. The FDA has determined that such clearance or approval is not necessary. This test is used for clinical purposes. It should not be regarded as investigational or research. Performed At: [facility] [place], [place] [id number] Rush Farmer MD GE:[id number]   PSA     Status: Abnormal   Collection Time: 09/08/17  6:51 AM  Result Value Ref Range   Prostatic Specific Antigen 51.42 (H) 0.00 - 4.00 ng/mL    Comment: (NOTE) While PSA levels of <=4.0 ng/ml are reported as reference range, some men with levels below 4.0 ng/ml can have prostate cancer and many men with PSA above 4.0 ng/ml do not have prostate cancer.  Other tests such as free PSA, age specific reference ranges, PSA velocity and PSA doubling time may be helpful especially in men less than 55 years old. Performed at [facility], [place] [address], [place], [place] [address]   Basic metabolic panel     Status: None   Collection Time: 09/08/17  6:51 AM  Result Value Ref Range   Sodium 139 135 - 145 mmol/L   Potassium 4.0 3.5 - 5.1 mmol/L   Chloride 105 98 - 111  mmol/L    Comment: Please note change in reference range.   CO2 26 22 - 32 mmol/L   Glucose, Bld 89 70 - 99 mg/dL    Comment: Please note change in reference range.   BUN 7 6 - 20 mg/dL    Comment: Please note change in reference range.   Creatinine, Ser 0.81 0.61 - 1.24 mg/dL   Calcium 9.0 8.9 - 10.3 mg/dL   GFR calc non Af Amer >60 >60 mL/min   GFR calc Af Amer >60 >60 mL/min    Comment: (NOTE) The eGFR has been calculated using the CKD EPI equation. This calculation has not been validated in all clinical situations. eGFR's persistently <60 mL/min signify possible Chronic Kidney Disease.    Anion gap 8 5 - 15    Comment: Performed at [facility], [place] [address] [place], [place] [address]    Assessment/Plan: 1. SIRS (systemic inflammatory response syndrome) (HCC) Resolved. Doing well. Afebrile. Vitals stable. Patient to finish entire course of antibiotic. Will repeat CBC and CMP today. - CBC w/Diff - Comp Met (CMET)  2. Headache after spinal puncture IM Toradol given to abort headache. If recurring may need to consider Neurology assessment. - ketorolac (TORADOL) injection 60 mg  3. Elevated PSA Suspect prostatitis as cause of SIRS giving significant elevation of PSA and pelvic US findings. No urinary symptoms currently. Will obtain gc/chlamydia that was not obtained during hospitalization. Patient to see Urology later this week.  - Urine cytology ancillary only   Leeanne Rio, PA-C

## 2017-09-13 LAB — URINE CYTOLOGY ANCILLARY ONLY
Chlamydia: NEGATIVE
Neisseria Gonorrhea: NEGATIVE

## 2017-09-14 ENCOUNTER — Encounter: Payer: Self-pay | Admitting: Physician Assistant

## 2017-09-14 DIAGNOSIS — N410 Acute prostatitis: Secondary | ICD-10-CM | POA: Diagnosis not present

## 2017-09-16 NOTE — Progress Notes (Signed)
Daniel Mcclure at [facility] [address] [place], [place] [address] [phone]  Date:  09/20/2017   Name:  Daniel Mcclure.   DOB:  05-Dec-1980   MRN:  [id number]  PCP:  Brunetta Jeans, PA-C    Chief Complaint: Headache (still having severe headaches, once a day-improved but not resolved, takes two tylenol to help, has had imaging but all was normal)   History of Present Illness:  Daniel Mcclure. is a 37 y.o. very pleasant male patient who presents with the following:  Pt of [name], seen by myself once in February of this year for illness.  Here today with concern of headaches- he was admitted earlier this month and it looks like he had an LP to rule- out meningiits Admission summary as follws:  Admit date: 09/04/2017 Discharge date: 09/08/2017  Recommendations for Outpatient Follow-up:  1. Follow up with PCP in 1-2 weeks 2. Please follow-up with urology as an outpatient follow-up on PSA  Discharge Condition:stable CODE STATUS:full Diet recommendation: Heart Healthy   Brief/Interim Summary: 37 y.o.malecomplaining of fever chills since yesterday and urgency. In the ED found to be febrile, CT scan of the head negative  Discharge Diagnoses:  Principal Problem:   SIRS (systemic inflammatory response syndrome) (HCC) Active Problems:   Leukocytosis   Orthostatic hypotension   Enlarged prostate Sirs of unclear source, he was started on admission and IV vancomycin and Zosyn. Sepsis was rule out. He had no recent urological or dental procedures. Culture data remain negative HIV and hepatitis panel was negative. CT Angie of the chest showed no PE or infiltrates. Infectious disease was consulted His antibiotic was the escalated to IV Rocephin and doxycycline. Had a lumbar puncture over at [facility] that showed 0 white blood cells. Fluid hydration hurts lightheadedness resolved. Pelvic ultrasound was done that  showed an enlarged prostate he was started on Flomax which she will continue as an outpatient. A PSA was ordered and results are pending at the time of discharge he will follow-up with urology. He was sent home on 7 additional days of oral doxycycline which she will continue as an outpatient. 2D echo showed no abnormalities.  He did see [name] as well on 7/23 but it looks like this note is not yet finished   Pt relates that he has seen urology for post- hospital follow-up and they think he had prostatitis as the cause of his recent illness.  He saw Dr. Johnnye Sima at [facility]- he is following up in the future.  He has completed his course of doxycycline and does not have any sx of UTI or prostatitis at this time   He notes that he is overall feeling much better However he is still having frequent headaches and he may have to take tylenol once a day He did not have any HA yesterday however and so far today he is ok  It seems that his HA may have been related to the LP as he noted them after this procedure.  If he lays down the HA will go away Overall he feels like he is doing better- the headaches are easing off and tylenol is much more effective now in getting rid of the pain He may have had some aura last week but now resolved Vomiting is resolved He did get some motion sickness in the car a few days ago which is unlike him  His PSA was 51 in the  hospital c/w prostatis He is not having any more fever Appetite is getting better He did lost some weight during this whole illness   BP Readings from Last 3 Encounters:  09/20/17 104/62  09/12/17 112/72  09/08/17 120/68   He plans to RTW tomorrow-  He does seated, not physical work.   He works in Engineer, mining at [employer] He is married with 2 young daughters  Overall Daniel Mcclure feels like he is making positive progress in getting back to his normal state of health No feers    Patient Active Problem List   Diagnosis Date Noted  . Enlarged prostate  09/07/2017  . Orthostatic hypotension 09/06/2017  . Leukocytosis 09/05/2017  . SIRS (systemic inflammatory response syndrome) ([place]) 09/04/2017  . Acute lower UTI 09/04/2017  . Visit for preventive health examination 10/14/2014    Past Medical History:  Diagnosis Date  . Hallux valgus due to metatarsus primus varus 12/2014   right  . Hx of migraine headaches     Past Surgical History:  Procedure Laterality Date  . BUNIONECTOMY Left   . KNEE SURGERY Left 1992  . METATARSAL OSTEOTOMY WITH BUNIONECTOMY Right 01/01/2015   Procedure: FIRST METATARSAL SCARF OSTEOTOMY, MODIFIED MCBRIDE BUNIONECTOMY;  Surgeon: Wylene Simmer, MD;  Location: [facility];  Service: Orthopedics;  Laterality: Right;    Social History   Tobacco Use  . Smoking status: Never Smoker  . Smokeless tobacco: Never Used  Substance Use Topics  . Alcohol use: No  . Drug use: No    Family History  Problem Relation Age of Onset  . Hypertension Father   . Stroke Paternal Grandfather   . Cancer Neg Hx     No Known Allergies  Medication list has been reviewed and updated.  Current Outpatient Medications on File Prior to Visit  Medication Sig Dispense Refill  . acetaminophen (TYLENOL) 325 MG tablet Take 650 mg by mouth every 6 (six) hours as needed for mild pain.    . Ginkgo Biloba Extract 60 MG CAPS Take 60 mg by mouth daily.    . Multiple Vitamin (MULTIVITAMIN) tablet Take 1 tablet by mouth daily.     No current facility-administered medications on file prior to visit.     Review of Systems:  As per HPI- otherwise negative.   Physical Examination: Vitals:   09/20/17 0912  BP: 104/62  Pulse: 79  Resp: 16  SpO2: 98%   Vitals:   09/20/17 0912  Weight: 162 lb 12.8 oz (73.8 kg)  Height: 5' 10"  (1.778 m)   Body mass index is 23.36 kg/m. Ideal Body Weight: Weight in (lb) to have BMI = 25: 173.9  GEN: WDWN, NAD, Non-toxic, A & O x 3, looks well  HEENT: Atraumatic, Normocephalic.  Neck supple. No masses, No LAD.  No meningismus Ears and Nose: No external deformity. CV: RRR, No M/G/R. No JVD. No thrill. No extra heart sounds. PULM: CTA B, no wheezes, crackles, rhonchi. No retractions. No resp. distress. No accessory muscle use. ABD: S, NT, ND, +BS. No rebound. No HSM. EXTR: No c/c/e NEURO Normal gait. Normal strength and DTR of all limbs  PSYCH: Normally interactive. Conversant. Not depressed or anxious appearing.  Calm demeanor.    Assessment and Plan: Hospital discharge follow-up  SIRS (systemic inflammatory response syndrome) (HCC)  Headache after spinal puncture  Following up from hospital admission today He is overall making progress but is still having some post- LP headaches. However these are easing off He plans to  RTW tomorrow  He had labs done last week which are overall ok Will have him do 1/2 days at work for the rest of the week, then can go to full schedule if all is ok  He will alert me if not back to normal soon- in that case I can refer to neurology   Signed Lamar Blinks, MD

## 2017-09-20 ENCOUNTER — Encounter: Payer: Self-pay | Admitting: Family Medicine

## 2017-09-20 ENCOUNTER — Encounter

## 2017-09-20 ENCOUNTER — Telehealth: Payer: Self-pay

## 2017-09-20 ENCOUNTER — Ambulatory Visit: Payer: BLUE CROSS/BLUE SHIELD | Admitting: Family Medicine

## 2017-09-20 VITALS — BP 104/62 | HR 79 | Resp 16 | Ht 70.0 in | Wt 162.8 lb

## 2017-09-20 DIAGNOSIS — R6510 Systemic inflammatory response syndrome (SIRS) of non-infectious origin without acute organ dysfunction: Secondary | ICD-10-CM

## 2017-09-20 DIAGNOSIS — Z09 Encounter for follow-up examination after completed treatment for conditions other than malignant neoplasm: Secondary | ICD-10-CM

## 2017-09-20 DIAGNOSIS — G971 Other reaction to spinal and lumbar puncture: Secondary | ICD-10-CM

## 2017-09-20 NOTE — Telephone Encounter (Signed)
Copied from [place] [id number]. Topic: General - Other >> Sep 20, 2017  1:20 PM Daniel Mcclure wrote: Pt needing his forms faxed before 3 pm today.  He needs to start back to work tomorrow.

## 2017-09-20 NOTE — Patient Instructions (Signed)
It was good to see you today.  I am so glad that you are getting better!   Please let me know if your headaches do not continue to resolve over the next 1-2 weeks- .Sooner if worse.  If your headaches persist we will get you in to see neurology.  Please alert me if you are not doing ok and getting back to full strength

## 2017-09-20 NOTE — Telephone Encounter (Signed)
Pt was seen today by Dr. Lorelei Pont and he said that she was his new provider since Elyn Aquas is in [place]. Did you accept him as a new patient?

## 2017-09-21 LAB — ARBOVIRUS PANEL, ~~LOC~~ LAB

## 2017-09-26 ENCOUNTER — Other Ambulatory Visit (INDEPENDENT_AMBULATORY_CARE_PROVIDER_SITE_OTHER): Payer: BLUE CROSS/BLUE SHIELD

## 2017-09-26 DIAGNOSIS — R945 Abnormal results of liver function studies: Secondary | ICD-10-CM

## 2017-09-26 DIAGNOSIS — R7989 Other specified abnormal findings of blood chemistry: Secondary | ICD-10-CM

## 2017-09-26 LAB — HEPATIC FUNCTION PANEL
ALK PHOS: 39 U/L (ref 39–117)
ALT: 41 U/L (ref 0–53)
AST: 22 U/L (ref 0–37)
Albumin: 4.5 g/dL (ref 3.5–5.2)
BILIRUBIN DIRECT: 0.1 mg/dL (ref 0.0–0.3)
TOTAL PROTEIN: 7.1 g/dL (ref 6.0–8.3)
Total Bilirubin: 0.6 mg/dL (ref 0.2–1.2)

## 2017-10-27 DIAGNOSIS — N410 Acute prostatitis: Secondary | ICD-10-CM | POA: Diagnosis not present

## 2017-11-03 DIAGNOSIS — N410 Acute prostatitis: Secondary | ICD-10-CM | POA: Diagnosis not present

## 2017-11-03 DIAGNOSIS — R972 Elevated prostate specific antigen [PSA]: Secondary | ICD-10-CM | POA: Diagnosis not present

## 2017-12-07 DIAGNOSIS — Z23 Encounter for immunization: Secondary | ICD-10-CM | POA: Diagnosis not present

## 2018-09-18 ENCOUNTER — Encounter: Payer: Self-pay | Admitting: Family Medicine

## 2018-09-18 ENCOUNTER — Other Ambulatory Visit: Payer: Self-pay

## 2018-09-19 NOTE — Progress Notes (Addendum)
[facility] at [facility] [address], [place], [place] [address] [place] [phone]  Date:  09/20/2018   Name:  Daniel Mcclure.   DOB:  1981-01-20   MRN:  [id number]  PCP:  Darreld Mclean, MD    Chief Complaint: Annual Exam   History of Present Illness:  Daniel Mcclure. is a 38 y.o. very pleasant male patient who presents with the following:  Here today for a CPE I last saw him in July of 2019 after he was admitted with SIRS, etiology unclear.  He was inpt for 4 days  Eventually thought to be related to prostatitis, he was followed up by urology.  Otherwise he has been generally in good health.    Labs- due. 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  They plan to be online for at least the start of the school year He is working from home- he is now under [employer] system  He is working out at home regularly, cardio and weights Overall he is feeling fine  Never a smoker  BP Readings from Last 3 Encounters:  09/20/18 104/68  09/20/17 104/62  09/12/17 112/72     Patient Active Problem List   Diagnosis Date Noted  . Enlarged prostate 09/07/2017  . Orthostatic hypotension 09/06/2017  . SIRS (systemic inflammatory response syndrome) ([place]) 09/04/2017  . Acute lower UTI 09/04/2017    Past Medical History:  Diagnosis Date  . Hallux valgus due to metatarsus primus varus 12/2014   right  . Hx of migraine headaches     Past Surgical History:  Procedure Laterality Date  . BUNIONECTOMY Left   . KNEE SURGERY Left 1992  . METATARSAL OSTEOTOMY WITH BUNIONECTOMY Right 01/01/2015   Procedure: FIRST METATARSAL SCARF OSTEOTOMY, MODIFIED MCBRIDE BUNIONECTOMY;  Surgeon: Wylene Simmer, MD;  Location: [facility];  Service: Orthopedics;  Laterality: Right;    Social History   Tobacco Use  . Smoking status: Never Smoker  . Smokeless tobacco: Never Used  Substance Use Topics  . Alcohol use: No  . Drug use: No    Family History  Problem Relation Age of Onset  . Hypertension Father   . Stroke Paternal Grandfather   . Cancer Neg Hx     No Known Allergies  Medication list has been reviewed and updated.  No current outpatient medications on file prior to visit.   No current facility-administered medications on file prior to visit.     Review of Systems:  As per HPI- otherwise negative. No CP or SOB  He reports no family history of early CAD or cancers  Physical Examination: Vitals:   09/20/18 0906  BP: 104/68  Pulse: 61  Resp: 16  Temp: 98.1 F (36.7 C)  SpO2: 98%   Vitals:   09/20/18 0906  Weight: 166 lb (75.3 kg)  Height: 5' 10"  (1.778 m)   Body mass index is 23.82 kg/m. Ideal Body Weight: Weight in (lb) to have BMI = 25: 173.9  GEN: WDWN, NAD, Non-toxic, A & O x 3, appears to be in excellent health and fit HEENT: Atraumatic, Normocephalic. Neck supple. No masses, No LAD.  TM wnl  Ears and Nose: No external deformity. CV: RRR, No M/G/R. No JVD. No thrill. No extra heart sounds. PULM: CTA B, no wheezes, crackles, rhonchi. No retractions. No resp. distress. No accessory muscle use. ABD: S, NT, ND, +BS. No rebound. No HSM. EXTR: No c/c/e NEURO Normal gait.  PSYCH: Normally interactive. Conversant. Not depressed or anxious appearing.  Calm demeanor.    Assessment and Plan:   ICD-10-CM   1. Physical exam  Z00.00   2. Screening for hyperlipidemia  Z13.220 Lipid panel  3. Screening for deficiency anemia  Z13.0 CBC  4. Screening for diabetes mellitus  Z13.1 Comprehensive metabolic panel    Hemoglobin A1c   CPE today- generally in good health On review of his chart, our most recent urology records are from September and he was asked to follow-up in 6  months for repeat PSA Will make sure that he did this with lab reports Complete form for his job and fax in with labs   Follow-up: No follow-ups on file.  No orders of the defined types were placed in this encounter.  Orders Placed This Encounter  Procedures  . CBC  . Comprehensive metabolic panel  . Hemoglobin A1c  . Lipid panel    @SIGN @    Signed Lamar Blinks, MD  Received his labs, message to pt and completed form  Results for orders placed or performed in visit on 09/20/18  CBC  Result Value Ref Range   WBC 4.1 4.0 - 10.5 K/uL   RBC 5.31 4.22 - 5.81 Mil/uL   Platelets 290.0 150.0 - 400.0 K/uL   Hemoglobin 14.2 13.0 - 17.0 g/dL   HCT 43.4 39.0 - 52.0 %   MCV 81.7 78.0 - 100.0 fl   MCHC 32.6 30.0 - 36.0 g/dL   RDW 13.6 11.5 - 15.5 %  Comprehensive metabolic panel  Result Value Ref Range   Sodium 140 135 - 145 mEq/L   Potassium 3.9 3.5 - 5.1 mEq/L   Chloride 104 96 - 112 mEq/L   CO2 30 19 - 32 mEq/L   Glucose, Bld 87 70 - 99 mg/dL   BUN 11 6 - 23 mg/dL   Creatinine, Ser 1.04 0.40 - 1.50 mg/dL   Total Bilirubin 0.6 0.2 - 1.2 mg/dL   Alkaline Phosphatase 32 (L) 39 - 117 U/L   AST 19 0 - 37 U/L   ALT 17 0 - 53 U/L   Total Protein 6.8 6.0 - 8.3 g/dL   Albumin 4.5 3.5 - 5.2 g/dL   Calcium 9.5 8.4 - 10.5 mg/dL   GFR 96.56 >60.00 mL/min  Hemoglobin A1c  Result Value Ref Range   Hgb A1c MFr Bld 5.4 4.6 - 6.5 %  Lipid panel  Result Value Ref Range   Cholesterol 119 0 - 200 mg/dL   Triglycerides 70.0 0.0 - 149.0 mg/dL   HDL 49.20 >39.00 mg/dL   VLDL 14.0 0.0 - 40.0 mg/dL   LDL Cholesterol 56 0 - 99 mg/dL   Total CHOL/HDL Ratio 2    NonHDL 69.55

## 2018-09-20 ENCOUNTER — Ambulatory Visit: Payer: BC Managed Care – PPO | Admitting: Family Medicine

## 2018-09-20 ENCOUNTER — Other Ambulatory Visit: Payer: Self-pay

## 2018-09-20 ENCOUNTER — Encounter: Payer: Self-pay | Admitting: Family Medicine

## 2018-09-20 VITALS — BP 104/68 | HR 61 | Temp 98.1°F | Resp 16 | Ht 70.0 in | Wt 166.0 lb

## 2018-09-20 DIAGNOSIS — Z131 Encounter for screening for diabetes mellitus: Secondary | ICD-10-CM

## 2018-09-20 DIAGNOSIS — Z13220 Encounter for screening for lipoid disorders: Secondary | ICD-10-CM | POA: Diagnosis not present

## 2018-09-20 DIAGNOSIS — Z0000 Encounter for general adult medical examination without abnormal findings: Secondary | ICD-10-CM

## 2018-09-20 DIAGNOSIS — Z130 Encounter for screening for diseases of the blood and blood-forming organs and certain disorders involving the immune mechanism: Secondary | ICD-10-CM

## 2018-09-20 LAB — LIPID PANEL
Cholesterol: 119 mg/dL (ref 0–200)
HDL: 49.2 mg/dL (ref >39.00)
LDL Cholesterol: 56 mg/dL (ref 0–99)
NonHDL: 69.55
Total CHOL/HDL Ratio: 2
Triglycerides: 70 mg/dL (ref 0.0–149.0)
VLDL: 14 mg/dL (ref 0.0–40.0)

## 2018-09-20 LAB — CBC
HCT: 43.4 % (ref 39.0–52.0)
Hemoglobin: 14.2 g/dL (ref 13.0–17.0)
MCHC: 32.6 g/dL (ref 30.0–36.0)
MCV: 81.7 fl (ref 78.0–100.0)
Platelets: 290 10*3/uL (ref 150.0–400.0)
RBC: 5.31 Mil/uL (ref 4.22–5.81)
RDW: 13.6 % (ref 11.5–15.5)
WBC: 4.1 10*3/uL (ref 4.0–10.5)

## 2018-09-20 LAB — COMPREHENSIVE METABOLIC PANEL
ALT: 17 U/L (ref 0–53)
AST: 19 U/L (ref 0–37)
Albumin: 4.5 g/dL (ref 3.5–5.2)
Alkaline Phosphatase: 32 U/L — ABNORMAL LOW (ref 39–117)
BUN: 11 mg/dL (ref 6–23)
CO2: 30 mEq/L (ref 19–32)
Calcium: 9.5 mg/dL (ref 8.4–10.5)
Chloride: 104 mEq/L (ref 96–112)
Creatinine, Ser: 1.04 mg/dL (ref 0.40–1.50)
GFR: 96.56 mL/min (ref >60.00)
Glucose, Bld: 87 mg/dL (ref 70–99)
Potassium: 3.9 mEq/L (ref 3.5–5.1)
Sodium: 140 mEq/L (ref 135–145)
Total Bilirubin: 0.6 mg/dL (ref 0.2–1.2)
Total Protein: 6.8 g/dL (ref 6.0–8.3)

## 2018-09-20 LAB — HEMOGLOBIN A1C: Hgb A1c MFr Bld: 5.4 % (ref 4.6–6.5)

## 2018-09-20 NOTE — Patient Instructions (Signed)
Great to see you again today - I hope that you and your family have a great year!  I will be in touch with your labs asap Continue to exercise and take good care of yourself  I will complete your insurance form and fax in once your labs come back Remember you will be due for a tetanus next year- we can give you a booster at your next physical.  If any dirty wound in the meantime please get a booster asap    Health Maintenance, Male Adopting a healthy lifestyle and getting preventive care are important in promoting health and wellness. Ask your health care provider about:  The right schedule for you to have regular tests and exams.  Things you can do on your own to prevent diseases and keep yourself healthy. What should I know about diet, weight, and exercise? Eat a healthy diet   Eat a diet that includes plenty of vegetables, fruits, low-fat dairy products, and lean protein.  Do not eat a lot of foods that are high in solid fats, added sugars, or sodium. Maintain a healthy weight Body mass index (BMI) is a measurement that can be used to identify possible weight problems. It estimates body fat based on height and weight. Your health care provider can help determine your BMI and help you achieve or maintain a healthy weight. Get regular exercise Get regular exercise. This is one of the most important things you can do for your health. Most adults should:  Exercise for at least 150 minutes each week. The exercise should increase your heart rate and make you sweat (moderate-intensity exercise).  Do strengthening exercises at least twice a week. This is in addition to the moderate-intensity exercise.  Spend less time sitting. Even light physical activity can be beneficial. Watch cholesterol and blood lipids Have your blood tested for lipids and cholesterol at 38 years of age, then have this test every 5 years. You may need to have your cholesterol levels checked more often if:  Your  lipid or cholesterol levels are high.  You are older than 38 years of age.  You are at high risk for heart disease. What should I know about cancer screening? Many types of cancers can be detected early and may often be prevented. Depending on your health history and family history, you may need to have cancer screening at various ages. This may include screening for:  Colorectal cancer.  Prostate cancer.  Skin cancer.  Lung cancer. What should I know about heart disease, diabetes, and high blood pressure? Blood pressure and heart disease  High blood pressure causes heart disease and increases the risk of stroke. This is more likely to develop in people who have high blood pressure readings, are of African descent, or are overweight.  Talk with your health care provider about your target blood pressure readings.  Have your blood pressure checked: ? Every 3-5 years if you are 76-43 years of age. ? Every year if you are 49 years old or older.  If you are between the ages of 16 and 4 and are a current or former smoker, ask your health care provider if you should have a one-time screening for abdominal aortic aneurysm (AAA). Diabetes Have regular diabetes screenings. This checks your fasting blood sugar level. Have the screening done:  Once every three years after age 63 if you are at a normal weight and have a low risk for diabetes.  More often and at a younger age  if you are overweight or have a high risk for diabetes. What should I know about preventing infection? Hepatitis B If you have a higher risk for hepatitis B, you should be screened for this virus. Talk with your health care provider to find out if you are at risk for hepatitis B infection. Hepatitis C Blood testing is recommended for:  Everyone born from [birth year] through 1965.  Anyone with known risk factors for hepatitis C. Sexually transmitted infections (STIs)  You should be screened each year for STIs, including  gonorrhea and chlamydia, if: ? You are sexually active and are younger than 38 years of age. ? You are older than 38 years of age and your health care provider tells you that you are at risk for this type of infection. ? Your sexual activity has changed since you were last screened, and you are at increased risk for chlamydia or gonorrhea. Ask your health care provider if you are at risk.  Ask your health care provider about whether you are at high risk for HIV. Your health care provider may recommend a prescription medicine to help prevent HIV infection. If you choose to take medicine to prevent HIV, you should first get tested for HIV. You should then be tested every 3 months for as long as you are taking the medicine. Follow these instructions at home: Lifestyle  Do not use any products that contain nicotine or tobacco, such as cigarettes, e-cigarettes, and chewing tobacco. If you need help quitting, ask your health care provider.  Do not use street drugs.  Do not share needles.  Ask your health care provider for help if you need support or information about quitting drugs. Alcohol use  Do not drink alcohol if your health care provider tells you not to drink.  If you drink alcohol: ? Limit how much you have to 0-2 drinks a day. ? Be aware of how much alcohol is in your drink. In the [place] one drink equals one 12 oz bottle of beer (355 mL), one 5 oz glass of wine (148 mL), or one 1 oz glass of hard liquor (44 mL). General instructions  Schedule regular health, dental, and eye exams.  Stay current with your vaccines.  Tell your health care provider if: ? You often feel depressed. ? You have ever been abused or do not feel safe at home. Summary  Adopting a healthy lifestyle and getting preventive care are important in promoting health and wellness.  Follow your health care provider's instructions about healthy diet, exercising, and getting tested or screened for diseases.   Follow your health care provider's instructions on monitoring your cholesterol and blood pressure. This information is not intended to replace advice given to you by your health care provider. Make sure you discuss any questions you have with your health care provider. Document Released: 08/06/2007 Document Revised: 01/31/2018 Document Reviewed: 01/31/2018 Elsevier Patient Education  2020 [employer].

## 2019-03-01 DIAGNOSIS — Z20828 Contact with and (suspected) exposure to other viral communicable diseases: Secondary | ICD-10-CM | POA: Diagnosis not present

## 2019-05-07 NOTE — Progress Notes (Addendum)
[place] [facility] [address] [place], [place] [address] [place] [phone]  Date:  05/08/2019   Name:  Daniel Mcclure.   DOB:  1980/03/28   MRN:  [id number]  PCP:  Daniel Mclean, MD    Chief Complaint: Annual Exam and Wrist Pain (left wrist pain, 3 4 days, swelling)   History of Present Illness:  Daniel Harts. is a 39 y.o. very pleasant male patient who presents with the following:  Gentleman here today for routine physical Last seen by myself in July 2020 for physical exam  He has been generally healthy except for an episode in 2019 when he developed SIRS and was admitted for 4 days.  Symptoms eventually thought related to prostatitis.  He recovered fully He hurt his left wrist 3-4 days ago- he is not sure if this is due to overuse or perhaps he slept on it wrong.  He does not recall any particular injury such as a fall.  He is wearing an Ace bandage which helps some  He is married with 2 daughters, 30 and 3.  He works for [employer] Prostate cancer family history - negative  Colon cancer family history also negative Tetanus vaccine is now due; will update today Flu shot- not done  Labs today- update today   He is fasting today  He is exercising by lifting weights and running, he likes to walk with his wife in their neighborhood most days as well  Besides his wrist he is feeling well  Patient Active Problem List   Diagnosis Date Noted  . Enlarged prostate 09/07/2017  . Orthostatic hypotension 09/06/2017  . SIRS (systemic inflammatory response syndrome) ([place]) 09/04/2017  . Acute lower UTI 09/04/2017    Past Medical History:  Diagnosis Date  . Hallux valgus due to metatarsus primus varus 12/2014   right  . Hx of migraine headaches     Past Surgical History:  Procedure Laterality Date  . BUNIONECTOMY Left   . KNEE SURGERY Left 1992  . METATARSAL OSTEOTOMY WITH BUNIONECTOMY Right  01/01/2015   Procedure: FIRST METATARSAL SCARF OSTEOTOMY, MODIFIED MCBRIDE BUNIONECTOMY;  Surgeon: Wylene Simmer, MD;  Location: [facility];  Service: Orthopedics;  Laterality: Right;    Social History   Tobacco Use  . Smoking status: Never Smoker  . Smokeless tobacco: Never Used  Substance Use Topics  . Alcohol use: No  . Drug use: No    Family History  Problem Relation Age of Onset  . Hypertension Father   . Stroke Paternal Grandfather   . Cancer Neg Hx     No Known Allergies  Medication list has been reviewed and updated.  No current outpatient medications on file prior to visit.   No current facility-administered medications on file prior to visit.    Review of Systems:  As per HPI- otherwise negative.   Physical Examination: Vitals:   05/08/19 0943  BP: 108/72  Pulse: (!) 54  Resp: 16  Temp: (!) 96.8 F (36 C)  SpO2: 100%   Vitals:   05/08/19 0943  Weight: 167 lb (75.8 kg)  Height: 5' 10"  (1.778 m)   Body mass index is 23.96 kg/m. Ideal Body Weight: Weight in (lb) to have BMI = 25: 173.9  GEN: no acute distress.  Normal weight, looks well HEENT: Atraumatic, Normocephalic.  Ears and Nose: No external deformity. CV: RRR, No M/G/R. No JVD. No thrill.  No extra heart sounds. PULM: CTA B, no wheezes, crackles, rhonchi. No retractions. No resp. distress. No accessory muscle use. ABD: S, NT, ND, +BS. No rebound. No HSM. EXTR: No c/c/e PSYCH: Normally interactive. Conversant.  Left elbow is normal.  He notes tenderness over the ulnar aspect of the left wrist, no swelling or redness.  Negative Finkelstein's test.  The metacarpals are negative.  Grip strength normal   Assessment and Plan: Physical exam  Screening for hyperlipidemia - Plan: Lipid panel  Screening for deficiency anemia - Plan: CBC  Screening for diabetes mellitus - Plan: Comprehensive metabolic panel, Hemoglobin A1c  Immunization due - Plan: Td vaccine greater than or  equal to 7yo preservative free IM  Sprain of left wrist, initial encounter  Generally very healthy gentleman here today for a physical exam.  Updated tetanus.  Discussed COVID-19 vaccine, encouraged him to get this when he is able. Labs pain as above, I will be in touch with his results ASAP He does have an insurance form which will need to be completed once his labs return Discussed his wrist pain.  I am not certain the exact cause of his pain.  I suggested using an over-the-counter wrist splint for a few days, if this does not relieve his pain he will let me know.  Referral to orthopedics in that case This visit occurred during the SARS-CoV-2 public health emergency.  Safety protocols were in place, including screening questions prior to the visit, additional usage of staff PPE, and extensive cleaning of exam room while observing appropriate contact time as indicated for disinfecting solutions.    Signed Lamar Blinks, MD Received his labs as below, message to pt. Insurance form completed and faxed Results for orders placed or performed in visit on 05/08/19  Comprehensive metabolic panel  Result Value Ref Range   Sodium 136 135 - 145 mEq/L   Potassium 4.1 3.5 - 5.1 mEq/L   Chloride 100 96 - 112 mEq/L   CO2 33 (H) 19 - 32 mEq/L   Glucose, Bld 83 70 - 99 mg/dL   BUN 13 6 - 23 mg/dL   Creatinine, Ser 1.08 0.40 - 1.50 mg/dL   Total Bilirubin 0.9 0.2 - 1.2 mg/dL   Alkaline Phosphatase 37 (L) 39 - 117 U/L   AST 21 0 - 37 U/L   ALT 18 0 - 53 U/L   Total Protein 7.3 6.0 - 8.3 g/dL   Albumin 4.5 3.5 - 5.2 g/dL   GFR 92.14 >60.00 mL/min   Calcium 9.8 8.4 - 10.5 mg/dL  CBC  Result Value Ref Range   WBC 4.3 4.0 - 10.5 K/uL   RBC 5.55 4.22 - 5.81 Mil/uL   Platelets 307.0 150.0 - 400.0 K/uL   Hemoglobin 14.9 13.0 - 17.0 g/dL   HCT 45.1 39.0 - 52.0 %   MCV 81.2 78.0 - 100.0 fl   MCHC 33.0 30.0 - 36.0 g/dL   RDW 13.4 11.5 - 15.5 %  Hemoglobin A1c  Result Value Ref Range   Hgb A1c MFr  Bld 5.4 4.6 - 6.5 %  Lipid panel  Result Value Ref Range   Cholesterol 134 0 - 200 mg/dL   Triglycerides 83.0 0.0 - 149.0 mg/dL   HDL 54.40 >39.00 mg/dL   VLDL 16.6 0.0 - 40.0 mg/dL   LDL Cholesterol 63 0 - 99 mg/dL   Total CHOL/HDL Ratio 2    NonHDL 79.84

## 2019-05-07 NOTE — Patient Instructions (Addendum)
It was great to see you again today, I will be in touch with your labs as soon as possible Tetanus booster given today I do encourage you to get a covid vaccine when you are able!   Please try an OTC wrist splint for a few days. If your wrist is still bothersome by next week please message me and I will refer you to orthopedics We will complete your work form and fax in as soon as your labs come in    Health Maintenance, Male Adopting a healthy lifestyle and getting preventive care are important in promoting health and wellness. Ask your health care provider about:  The right schedule for you to have regular tests and exams.  Things you can do on your own to prevent diseases and keep yourself healthy. What should I know about diet, weight, and exercise? Eat a healthy diet   Eat a diet that includes plenty of vegetables, fruits, low-fat dairy products, and lean protein.  Do not eat a lot of foods that are high in solid fats, added sugars, or sodium. Maintain a healthy weight Body mass index (BMI) is a measurement that can be used to identify possible weight problems. It estimates body fat based on height and weight. Your health care provider can help determine your BMI and help you achieve or maintain a healthy weight. Get regular exercise Get regular exercise. This is one of the most important things you can do for your health. Most adults should:  Exercise for at least 150 minutes each week. The exercise should increase your heart rate and make you sweat (moderate-intensity exercise).  Do strengthening exercises at least twice a week. This is in addition to the moderate-intensity exercise.  Spend less time sitting. Even light physical activity can be beneficial. Watch cholesterol and blood lipids Have your blood tested for lipids and cholesterol at 39 years of age, then have this test every 5 years. You may need to have your cholesterol levels checked more often if:  Your lipid or  cholesterol levels are high.  You are older than 39 years of age.  You are at high risk for heart disease. What should I know about cancer screening? Many types of cancers can be detected early and may often be prevented. Depending on your health history and family history, you may need to have cancer screening at various ages. This may include screening for:  Colorectal cancer.  Prostate cancer.  Skin cancer.  Lung cancer. What should I know about heart disease, diabetes, and high blood pressure? Blood pressure and heart disease  High blood pressure causes heart disease and increases the risk of stroke. This is more likely to develop in people who have high blood pressure readings, are of African descent, or are overweight.  Talk with your health care provider about your target blood pressure readings.  Have your blood pressure checked: ? Every 3-5 years if you are 27-41 years of age. ? Every year if you are 34 years old or older.  If you are between the ages of 20 and 39 and are a current or former smoker, ask your health care provider if you should have a one-time screening for abdominal aortic aneurysm (AAA). Diabetes Have regular diabetes screenings. This checks your fasting blood sugar level. Have the screening done:  Once every three years after age 29 if you are at a normal weight and have a low risk for diabetes.  More often and at a younger age if  you are overweight or have a high risk for diabetes. What should I know about preventing infection? Hepatitis B If you have a higher risk for hepatitis B, you should be screened for this virus. Talk with your health care provider to find out if you are at risk for hepatitis B infection. Hepatitis C Blood testing is recommended for:  Everyone born from [birth year] through 1965.  Anyone with known risk factors for hepatitis C. Sexually transmitted infections (STIs)  You should be screened each year for STIs, including gonorrhea  and chlamydia, if: ? You are sexually active and are younger than 39 years of age. ? You are older than 39 years of age and your health care provider tells you that you are at risk for this type of infection. ? Your sexual activity has changed since you were last screened, and you are at increased risk for chlamydia or gonorrhea. Ask your health care provider if you are at risk.  Ask your health care provider about whether you are at high risk for HIV. Your health care provider may recommend a prescription medicine to help prevent HIV infection. If you choose to take medicine to prevent HIV, you should first get tested for HIV. You should then be tested every 3 months for as long as you are taking the medicine. Follow these instructions at home: Lifestyle  Do not use any products that contain nicotine or tobacco, such as cigarettes, e-cigarettes, and chewing tobacco. If you need help quitting, ask your health care provider.  Do not use street drugs.  Do not share needles.  Ask your health care provider for help if you need support or information about quitting drugs. Alcohol use  Do not drink alcohol if your health care provider tells you not to drink.  If you drink alcohol: ? Limit how much you have to 0-2 drinks a day. ? Be aware of how much alcohol is in your drink. In the [place] one drink equals one 12 oz bottle of beer (355 mL), one 5 oz glass of wine (148 mL), or one 1 oz glass of hard liquor (44 mL). General instructions  Schedule regular health, dental, and eye exams.  Stay current with your vaccines.  Tell your health care provider if: ? You often feel depressed. ? You have ever been abused or do not feel safe at home. Summary  Adopting a healthy lifestyle and getting preventive care are important in promoting health and wellness.  Follow your health care provider's instructions about healthy diet, exercising, and getting tested or screened for diseases.  Follow your  health care provider's instructions on monitoring your cholesterol and blood pressure. This information is not intended to replace advice given to you by your health care provider. Make sure you discuss any questions you have with your health care provider. Document Revised: 01/31/2018 Document Reviewed: 01/31/2018 Elsevier Patient Education  2020 [employer].

## 2019-05-08 ENCOUNTER — Encounter: Payer: Self-pay | Admitting: Family Medicine

## 2019-05-08 ENCOUNTER — Ambulatory Visit (INDEPENDENT_AMBULATORY_CARE_PROVIDER_SITE_OTHER): Payer: BC Managed Care – PPO | Admitting: Family Medicine

## 2019-05-08 ENCOUNTER — Other Ambulatory Visit: Payer: Self-pay

## 2019-05-08 VITALS — BP 108/72 | HR 54 | Temp 96.8°F | Resp 16 | Ht 70.0 in | Wt 167.0 lb

## 2019-05-08 DIAGNOSIS — Z130 Encounter for screening for diseases of the blood and blood-forming organs and certain disorders involving the immune mechanism: Secondary | ICD-10-CM

## 2019-05-08 DIAGNOSIS — Z0000 Encounter for general adult medical examination without abnormal findings: Secondary | ICD-10-CM | POA: Diagnosis not present

## 2019-05-08 DIAGNOSIS — S63502A Unspecified sprain of left wrist, initial encounter: Secondary | ICD-10-CM

## 2019-05-08 DIAGNOSIS — Z23 Encounter for immunization: Secondary | ICD-10-CM

## 2019-05-08 DIAGNOSIS — Z13220 Encounter for screening for lipoid disorders: Secondary | ICD-10-CM | POA: Diagnosis not present

## 2019-05-08 DIAGNOSIS — Z131 Encounter for screening for diabetes mellitus: Secondary | ICD-10-CM

## 2019-05-08 LAB — COMPREHENSIVE METABOLIC PANEL
ALT: 18 U/L (ref 0–53)
AST: 21 U/L (ref 0–37)
Albumin: 4.5 g/dL (ref 3.5–5.2)
Alkaline Phosphatase: 37 U/L — ABNORMAL LOW (ref 39–117)
BUN: 13 mg/dL (ref 6–23)
CO2: 33 mEq/L — ABNORMAL HIGH (ref 19–32)
Calcium: 9.8 mg/dL (ref 8.4–10.5)
Chloride: 100 mEq/L (ref 96–112)
Creatinine, Ser: 1.08 mg/dL (ref 0.40–1.50)
GFR: 92.14 mL/min (ref >60.00)
Glucose, Bld: 83 mg/dL (ref 70–99)
Potassium: 4.1 mEq/L (ref 3.5–5.1)
Sodium: 136 mEq/L (ref 135–145)
Total Bilirubin: 0.9 mg/dL (ref 0.2–1.2)
Total Protein: 7.3 g/dL (ref 6.0–8.3)

## 2019-05-08 LAB — CBC
HCT: 45.1 % (ref 39.0–52.0)
Hemoglobin: 14.9 g/dL (ref 13.0–17.0)
MCHC: 33 g/dL (ref 30.0–36.0)
MCV: 81.2 fl (ref 78.0–100.0)
Platelets: 307 10*3/uL (ref 150.0–400.0)
RBC: 5.55 Mil/uL (ref 4.22–5.81)
RDW: 13.4 % (ref 11.5–15.5)
WBC: 4.3 10*3/uL (ref 4.0–10.5)

## 2019-05-08 LAB — LIPID PANEL
Cholesterol: 134 mg/dL (ref 0–200)
HDL: 54.4 mg/dL (ref >39.00)
LDL Cholesterol: 63 mg/dL (ref 0–99)
NonHDL: 79.84
Total CHOL/HDL Ratio: 2
Triglycerides: 83 mg/dL (ref 0.0–149.0)
VLDL: 16.6 mg/dL (ref 0.0–40.0)

## 2019-05-08 LAB — HEMOGLOBIN A1C: Hgb A1c MFr Bld: 5.4 % (ref 4.6–6.5)

## 2019-11-11 ENCOUNTER — Encounter: Payer: Self-pay | Admitting: Family Medicine

## 2019-11-11 DIAGNOSIS — J351 Hypertrophy of tonsils: Secondary | ICD-10-CM

## 2020-05-09 NOTE — Progress Notes (Signed)
[place] at [facility] [address] [place], [place] [address] [phone]  Date:  05/13/2020   Name:  Daniel Mcclure.   DOB:  1980/05/23   MRN:  [id number]  PCP:  Darreld Mclean, MD    Chief Complaint: Annual Exam   History of Present Illness:  Daniel Mcclure. is a 40 y.o. very pleasant male patient who presents with the following:  Here today for a CPE Last seen by myself one year ago He has been generally healthy except for an episode in 2019 when he developed SIRS and was admitted for 4 days.  Symptoms eventually thought related to prostatitis.  He recovered fully  Married with 2 girls ages 44 and 63, works for [employer] He also had some issues with tonsillar stones and underwent tonsillectomy last year per Dr Redmond Baseman- did well.  The recovery was tough but he notes improvement since this is done   covid series- done, no booster yet.   Flu- pt declines today Labs- done last week at [facility] and in chart- reviewed, all excellent   He is exercising daily- strength and cardio, some yoga and stretching  No CP or unusual SOB   He pulled a muscle in his back a month ago- it bothers him on occasion but does seem to be getting better Pain does not radiate to his legs, no numbness or weakness     Patient Active Problem List   Diagnosis Date Noted  . Enlarged prostate 09/07/2017  . Orthostatic hypotension 09/06/2017  . SIRS (systemic inflammatory response syndrome) ([place]) 09/04/2017  . Acute lower UTI 09/04/2017    Past Medical History:  Diagnosis Date  . Hallux valgus due to metatarsus primus varus 12/2014   right  . Hx of migraine headaches     Past Surgical History:  Procedure Laterality Date  . BUNIONECTOMY Left   . KNEE SURGERY Left 1992  . METATARSAL OSTEOTOMY WITH BUNIONECTOMY Right 01/01/2015   Procedure: FIRST METATARSAL SCARF OSTEOTOMY, MODIFIED MCBRIDE BUNIONECTOMY;  Surgeon: Wylene Simmer,  MD;  Location: [facility];  Service: Orthopedics;  Laterality: Right;    Social History   Tobacco Use  . Smoking status: Never Smoker  . Smokeless tobacco: Never Used  Vaping Use  . Vaping Use: Never used  Substance Use Topics  . Alcohol use: No  . Drug use: No    Family History  Problem Relation Age of Onset  . Hypertension Father   . Stroke Paternal Grandfather   . Cancer Neg Hx     No Known Allergies  Medication list has been reviewed and updated.  No current outpatient medications on file prior to visit.   No current facility-administered medications on file prior to visit.    Review of Systems:  As per HPI- otherwise negative.   Physical Examination: Vitals:   05/13/20 0905  BP: 114/62  Pulse: (!) 53  Resp: 16  Temp: (!) 97.5 F (36.4 C)  SpO2: 99%   Vitals:   05/13/20 0905  Weight: 165 lb (74.8 kg)  Height: 5' 10"  (1.778 m)   Body mass index is 23.68 kg/m. Ideal Body Weight: Weight in (lb) to have BMI = 25: 173.9  GEN: no acute distress. Looks well, fit build and normal weight  HEENT: Atraumatic, Normocephalic.   Bilateral TM wnl, oropharynx normal.  PEERL,EOMI.   Ears and Nose: No external deformity. CV: RRR, No M/G/R.  No JVD. No thrill. No extra heart sounds. PULM: CTA B, no wheezes, crackles, rhonchi. No retractions. No resp. distress. No accessory muscle use. ABD: S, NT, ND, +BS. No rebound. No HSM. EXTR: No c/c/e PSYCH: Normally interactive. Conversant.   Pulse Readings from Last 3 Encounters:  05/13/20 (!) 53  05/08/19 (!) 54  09/20/18 61    Assessment and Plan: Physical exam  Screening for hyperlipidemia  Screening for diabetes mellitus  Screening for deficiency anemia  Here today for a CPE- he is overall doing well continue healthy diet and exercise routine Labs are UTD Start PSA at 40 years old Encouraged covid vaccine booster This visit occurred during the SARS-CoV-2 public health emergency.  Safety  protocols were in place, including screening questions prior to the visit, additional usage of staff PPE, and extensive cleaning of exam room while observing appropriate contact time as indicated for disinfecting solutions.    Signed Lamar Blinks, MD

## 2020-05-09 NOTE — Patient Instructions (Addendum)
Good to see you again today, keep up the good work!   Labs all look great  Please consider getting your covid booster- I do think it is a good idea    Health Maintenance, Male Adopting a healthy lifestyle and getting preventive care are important in promoting health and wellness. Ask your health care provider about:  The right schedule for you to have regular tests and exams.  Things you can do on your own to prevent diseases and keep yourself healthy. What should I know about diet, weight, and exercise? Eat a healthy diet  Eat a diet that includes plenty of vegetables, fruits, low-fat dairy products, and lean protein.  Do not eat a lot of foods that are high in solid fats, added sugars, or sodium.   Maintain a healthy weight Body mass index (BMI) is a measurement that can be used to identify possible weight problems. It estimates body fat based on height and weight. Your health care provider can help determine your BMI and help you achieve or maintain a healthy weight. Get regular exercise Get regular exercise. This is one of the most important things you can do for your health. Most adults should:  Exercise for at least 150 minutes each week. The exercise should increase your heart rate and make you sweat (moderate-intensity exercise).  Do strengthening exercises at least twice a week. This is in addition to the moderate-intensity exercise.  Spend less time sitting. Even light physical activity can be beneficial. Watch cholesterol and blood lipids Have your blood tested for lipids and cholesterol at 40 years of age, then have this test every 5 years. You may need to have your cholesterol levels checked more often if:  Your lipid or cholesterol levels are high.  You are older than 40 years of age.  You are at high risk for heart disease. What should I know about cancer screening? Many types of cancers can be detected early and may often be prevented. Depending on your health  history and family history, you may need to have cancer screening at various ages. This may include screening for:  Colorectal cancer.  Prostate cancer.  Skin cancer.  Lung cancer. What should I know about heart disease, diabetes, and high blood pressure? Blood pressure and heart disease  High blood pressure causes heart disease and increases the risk of stroke. This is more likely to develop in people who have high blood pressure readings, are of African descent, or are overweight.  Talk with your health care provider about your target blood pressure readings.  Have your blood pressure checked: ? Every 3-5 years if you are 30-37 years of age. ? Every year if you are 4 years old or older.  If you are between the ages of 23 and 88 and are a current or former smoker, ask your health care provider if you should have a one-time screening for abdominal aortic aneurysm (AAA). Diabetes Have regular diabetes screenings. This checks your fasting blood sugar level. Have the screening done:  Once every three years after age 35 if you are at a normal weight and have a low risk for diabetes.  More often and at a younger age if you are overweight or have a high risk for diabetes. What should I know about preventing infection? Hepatitis B If you have a higher risk for hepatitis B, you should be screened for this virus. Talk with your health care provider to find out if you are at risk for hepatitis  B infection. Hepatitis C Blood testing is recommended for:  Everyone born from [birth year] through 1965.  Anyone with known risk factors for hepatitis C. Sexually transmitted infections (STIs)  You should be screened each year for STIs, including gonorrhea and chlamydia, if: ? You are sexually active and are younger than 40 years of age. ? You are older than 40 years of age and your health care provider tells you that you are at risk for this type of infection. ? Your sexual activity has changed since  you were last screened, and you are at increased risk for chlamydia or gonorrhea. Ask your health care provider if you are at risk.  Ask your health care provider about whether you are at high risk for HIV. Your health care provider may recommend a prescription medicine to help prevent HIV infection. If you choose to take medicine to prevent HIV, you should first get tested for HIV. You should then be tested every 3 months for as long as you are taking the medicine. Follow these instructions at home: Lifestyle  Do not use any products that contain nicotine or tobacco, such as cigarettes, e-cigarettes, and chewing tobacco. If you need help quitting, ask your health care provider.  Do not use street drugs.  Do not share needles.  Ask your health care provider for help if you need support or information about quitting drugs. Alcohol use  Do not drink alcohol if your health care provider tells you not to drink.  If you drink alcohol: ? Limit how much you have to 0-2 drinks a day. ? Be aware of how much alcohol is in your drink. In the [place] one drink equals one 12 oz bottle of beer (355 mL), one 5 oz glass of wine (148 mL), or one 1 oz glass of hard liquor (44 mL). General instructions  Schedule regular health, dental, and eye exams.  Stay current with your vaccines.  Tell your health care provider if: ? You often feel depressed. ? You have ever been abused or do not feel safe at home. Summary  Adopting a healthy lifestyle and getting preventive care are important in promoting health and wellness.  Follow your health care provider's instructions about healthy diet, exercising, and getting tested or screened for diseases.  Follow your health care provider's instructions on monitoring your cholesterol and blood pressure. This information is not intended to replace advice given to you by your health care provider. Make sure you discuss any questions you have with your health care  provider. Document Revised: 01/31/2018 Document Reviewed: 01/31/2018 Elsevier Patient Education  2021 [employer].

## 2020-05-13 ENCOUNTER — Other Ambulatory Visit: Payer: Self-pay

## 2020-05-13 ENCOUNTER — Encounter: Payer: Self-pay | Admitting: Family Medicine

## 2020-05-13 ENCOUNTER — Ambulatory Visit (INDEPENDENT_AMBULATORY_CARE_PROVIDER_SITE_OTHER): Payer: BC Managed Care – PPO | Admitting: Family Medicine

## 2020-05-13 VITALS — BP 114/62 | HR 53 | Temp 97.5°F | Resp 16 | Ht 70.0 in | Wt 165.0 lb

## 2020-05-13 DIAGNOSIS — Z130 Encounter for screening for diseases of the blood and blood-forming organs and certain disorders involving the immune mechanism: Secondary | ICD-10-CM

## 2020-05-13 DIAGNOSIS — Z0000 Encounter for general adult medical examination without abnormal findings: Secondary | ICD-10-CM | POA: Diagnosis not present

## 2020-05-13 DIAGNOSIS — Z131 Encounter for screening for diabetes mellitus: Secondary | ICD-10-CM | POA: Diagnosis not present

## 2020-05-13 DIAGNOSIS — Z13220 Encounter for screening for lipoid disorders: Secondary | ICD-10-CM

## 2020-06-17 IMAGING — CT CT HEAD W/O CM
3 series · 16 of 47 positions shown, 19 images · non-contrast
Comparison: None.

CLINICAL DATA: Two days of headache, fever, chills, weakness, and
fatigue.

EXAM:
CT HEAD WITHOUT CONTRAST
TECHNIQUE: Contiguous axial images were obtained from the base of the skull
through the vertex without intravenous contrast.

[Series 2: head wo · axial · 0.46mm/px · z∈[-169,-19]mm · 10 of 36 slices shown, 13 images]
[im 3/36  brain]
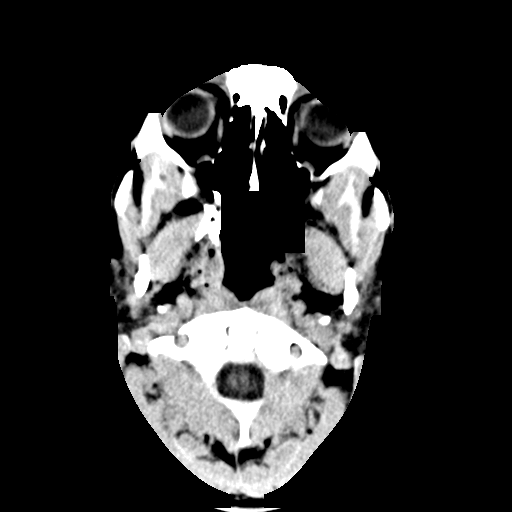
[im 3/36  bone]
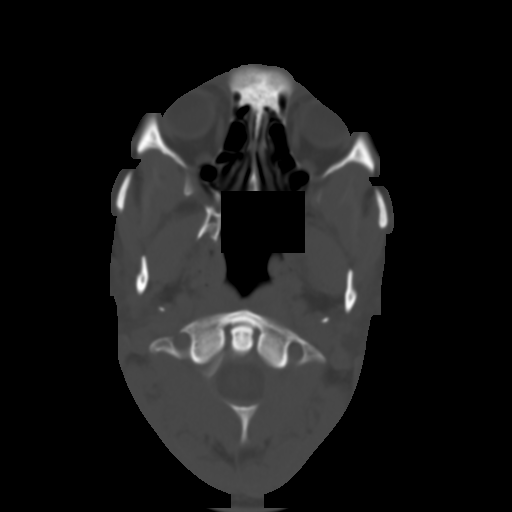
[im 7/36  brain]
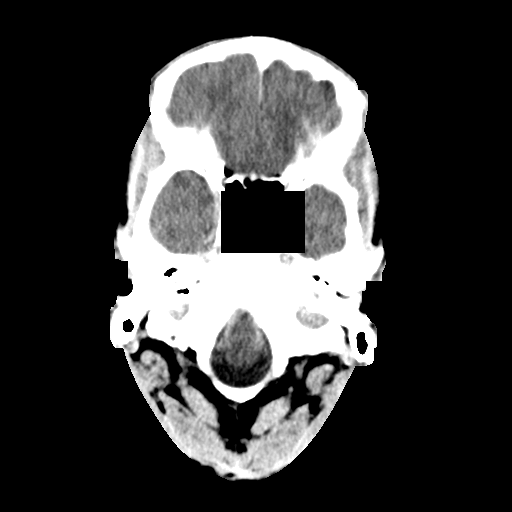
[im 10/36  brain]
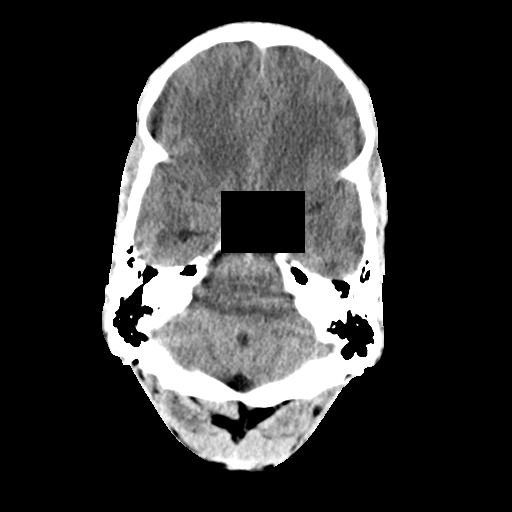
[im 13/36  brain]
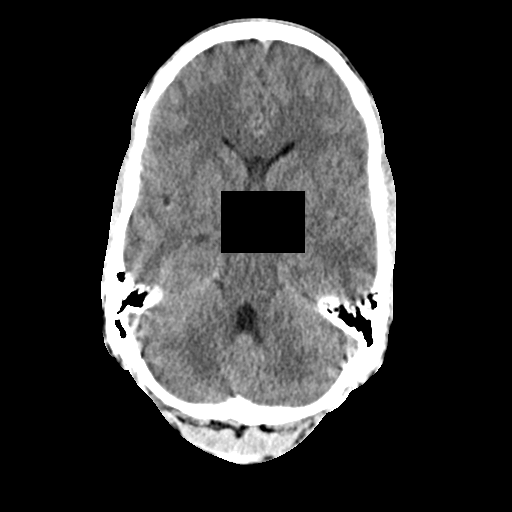
[im 16/36  brain]
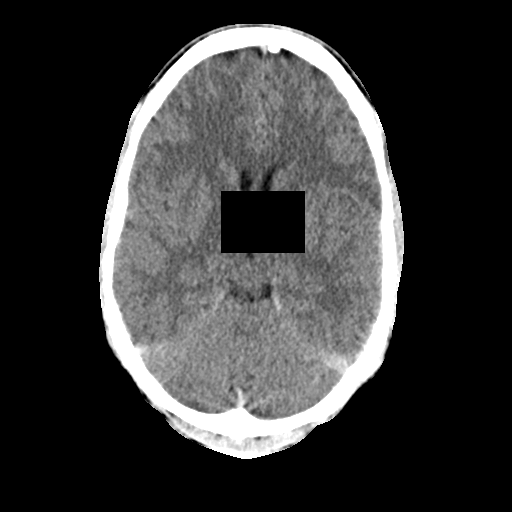
[im 16/36  bone]
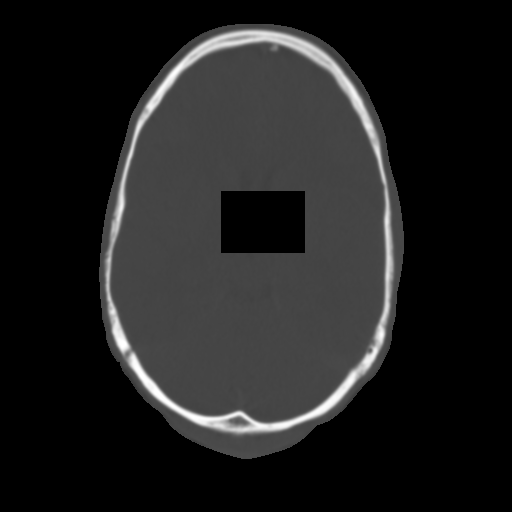
[im 20/36  brain]
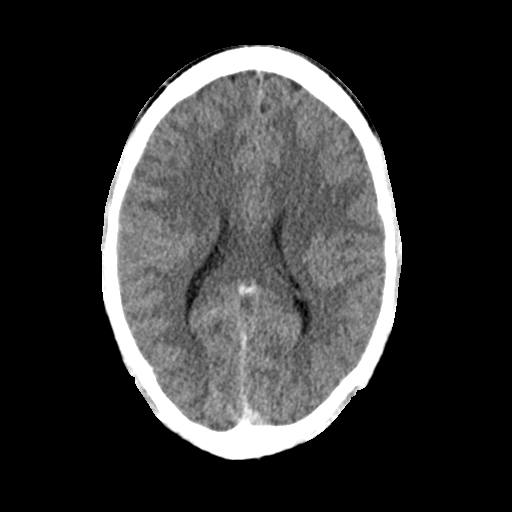
[im 23/36  brain]
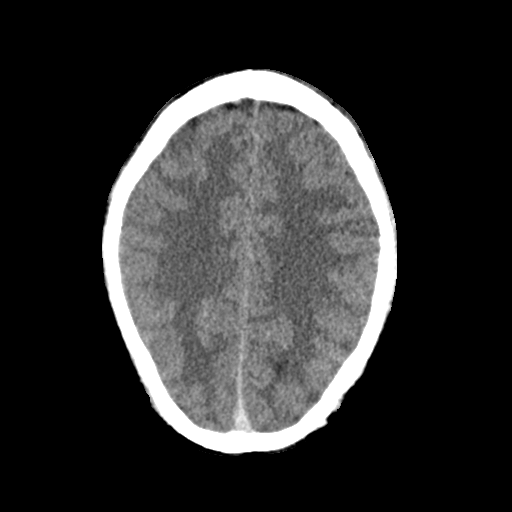
[im 27/36  brain]
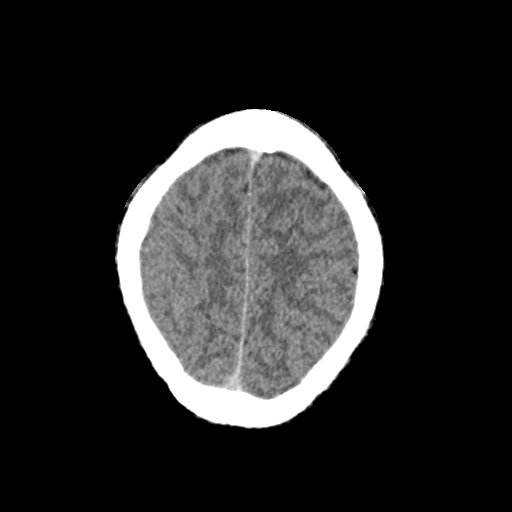
[im 29/36  brain]
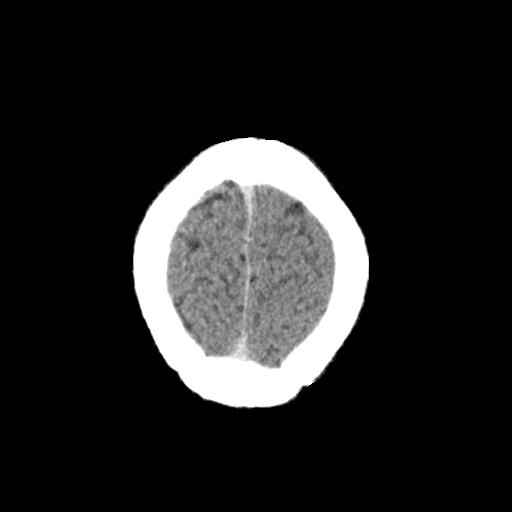
[im 29/36  bone]
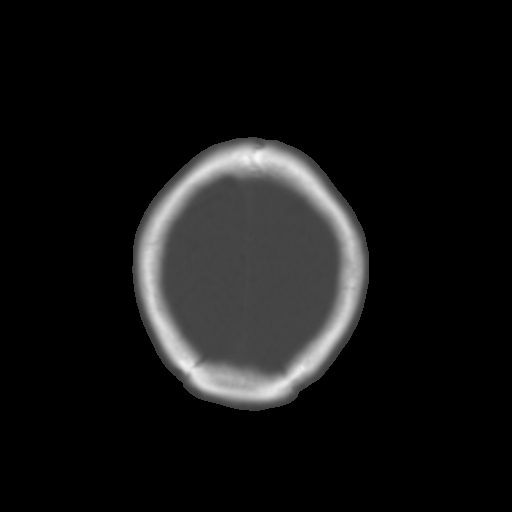
[im 33/36  brain]
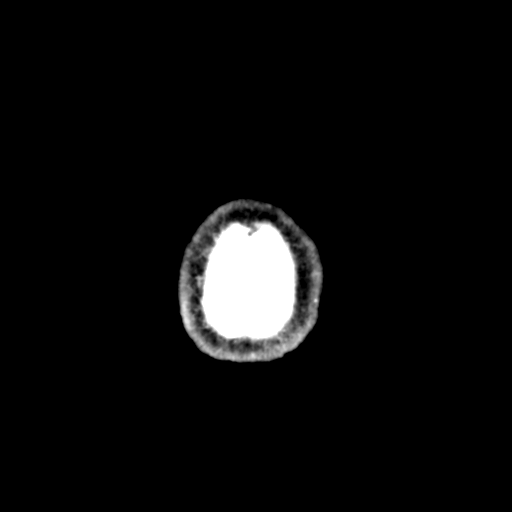

[Series 4: coronal soft · coronal · 0.33mm/px · 3 of 73 slices shown]
[im 25/73  brain]
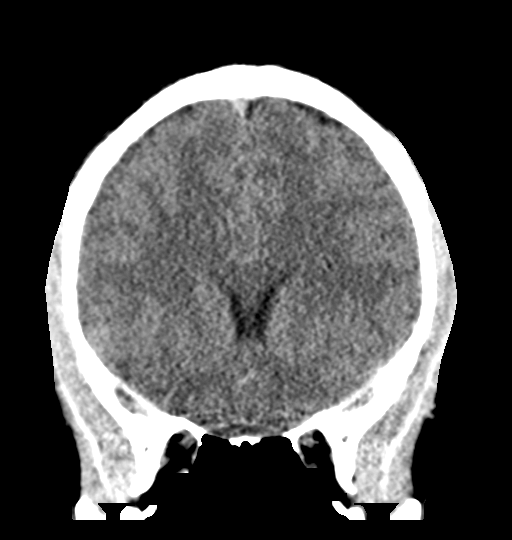
[im 33/73  brain]
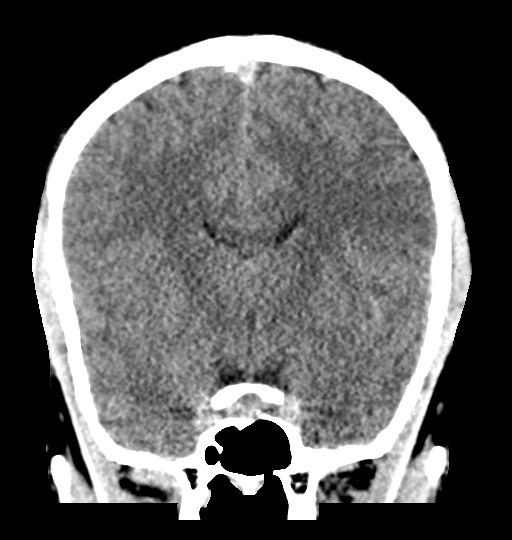
[im 41/73  brain]
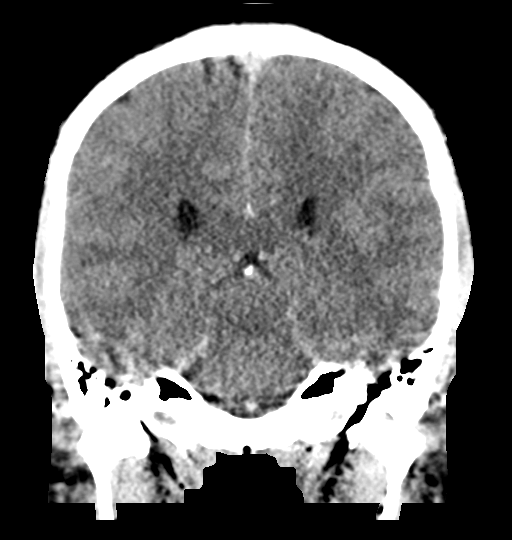

[Series 5: sag soft · sagittal · 0.35mm/px · 3 of 54 slices shown]
[im 18/54  brain]
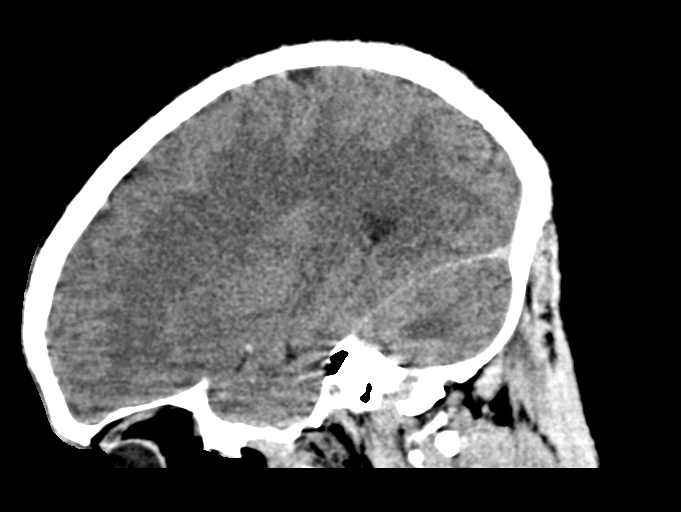
[im 27/54  brain]
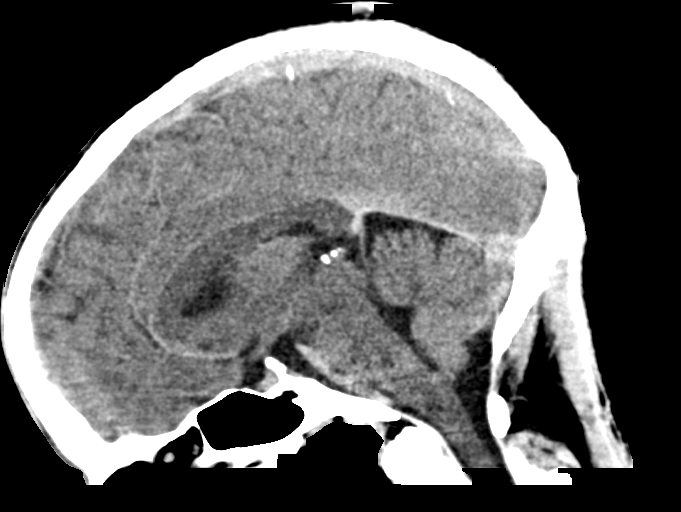
[im 36/54  brain]
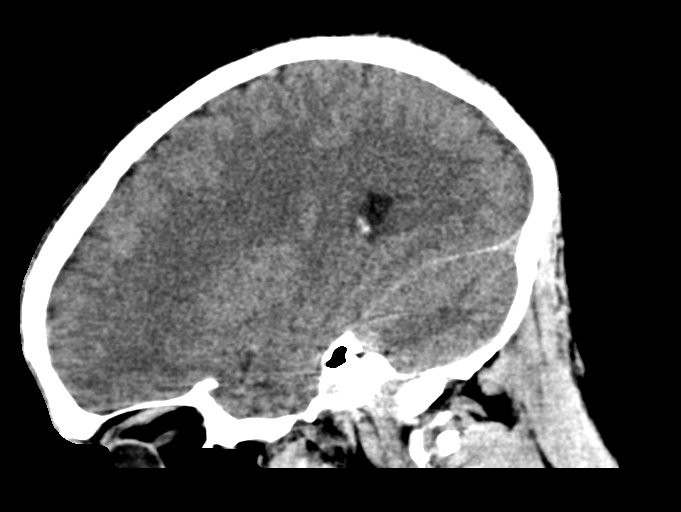

[16 of 47 positions shown; findings below may reference images not displayed]

FINDINGS: Brain: There is no evidence of acute infarct, intracranial
hemorrhage, mass, midline shift, or extra-axial fluid collection.
The ventricles and sulci are normal.

Vascular: No hyperdense vessel.

Skull: No fracture or focal osseous lesion.

Sinuses/Orbits: Visualized paranasal sinuses and mastoid air cells
are clear. Visualized orbits are unremarkable.

Other: None.
IMPRESSION: Negative head CT.

## 2020-08-27 ENCOUNTER — Encounter: Payer: Self-pay | Admitting: Family Medicine

## 2020-08-27 DIAGNOSIS — Z3009 Encounter for other general counseling and advice on contraception: Secondary | ICD-10-CM

## 2020-10-09 ENCOUNTER — Encounter: Payer: Self-pay | Admitting: Family Medicine

## 2021-03-05 ENCOUNTER — Encounter: Payer: Self-pay | Admitting: Family Medicine

## 2021-03-09 NOTE — Telephone Encounter (Signed)
Patient dropped off forms to be filled out Placed into [place] bin up front  Patient would like to be called when its ready to be picked up

## 2021-03-11 NOTE — Telephone Encounter (Signed)
In folder for signature.

## 2021-03-15 NOTE — Telephone Encounter (Signed)
Paperwork completed, called pt and let him know, will leave up front for him

## 2021-04-13 ENCOUNTER — Encounter: Payer: Self-pay | Admitting: Family Medicine

## 2021-04-13 DIAGNOSIS — Z1211 Encounter for screening for malignant neoplasm of colon: Secondary | ICD-10-CM

## 2021-05-23 ENCOUNTER — Encounter: Payer: Self-pay | Admitting: Family Medicine

## 2021-05-23 LAB — COLOGUARD: COLOGUARD: NEGATIVE

## 2022-07-30 NOTE — Progress Notes (Unsigned)
[facility] at [facility] [address], [address] [place], [place] [address] [phone]  Date:  08/03/2022   Name:  Daniel Mcclure.   DOB:  02-26-1980   MRN:  [id number]  PCP:  Pearline Cables, MD    Chief Complaint: No chief complaint on file.   History of Present Illness:  Daniel Mcclure. is a 42 y.o. very pleasant male patient who presents with the following:  Patient seen today for physical exam Most recent visit with myself was in March 2022  He has been generally healthy except for an episode in 2019 when he developed SIRS and was admitted for 4 days.  Symptoms eventually thought related to prostatitis.  He recovered fully  Also history of tonsillectomy due to recurrent tonsillar stones  He is married, 2 daughters aged 26 and 26 Enjoys regular exercise  Can update blood work  Patient Active Problem List   Diagnosis Date Noted   Enlarged prostate 09/07/2017   Orthostatic hypotension 09/06/2017   SIRS (systemic inflammatory response syndrome) (HCC) 09/04/2017   Acute lower UTI 09/04/2017    Past Medical History:  Diagnosis Date   Hallux valgus due to metatarsus primus varus 12/2014   right   Hx of migraine headaches     Past Surgical History:  Procedure Laterality Date   BUNIONECTOMY Left    KNEE SURGERY Left 1992   METATARSAL OSTEOTOMY WITH BUNIONECTOMY Right 01/01/2015   Procedure: FIRST METATARSAL SCARF OSTEOTOMY, MODIFIED MCBRIDE BUNIONECTOMY;  Surgeon: Toni Arthurs, MD;  Location: [facility];  Service: Orthopedics;  Laterality: Right;    Social History   Tobacco Use   Smoking status: Never   Smokeless tobacco: Never  Vaping Use   Vaping Use: Never used  Substance Use Topics   Alcohol use: No   Drug use: No    Family History  Problem Relation Age of Onset   Hypertension Father    Stroke Paternal Grandfather    Cancer Neg Hx     No Known Allergies  Medication list has  been reviewed and updated.  No current outpatient medications on file prior to visit.   No current facility-administered medications on file prior to visit.    Review of Systems:  As per HPI- otherwise negative.   Physical Examination: There were no vitals filed for this visit. There were no vitals filed for this visit. There is no height or weight on file to calculate BMI. Ideal Body Weight:    GEN: no acute distress. HEENT: Atraumatic, Normocephalic.  Ears and Nose: No external deformity. CV: RRR, No M/G/R. No JVD. No thrill. No extra heart sounds. PULM: CTA B, no wheezes, crackles, rhonchi. No retractions. No resp. distress. No accessory muscle use. ABD: S, NT, ND, +BS. No rebound. No HSM. EXTR: No c/c/e PSYCH: Normally interactive. Conversant.    Assessment and Plan: *** Physical exam today.  Encouraged healthy diet and exercise routine Will plan further follow- up pending labs.  Signed Abbe Amsterdam, MD

## 2022-07-30 NOTE — Patient Instructions (Incomplete)
It was great to see you again today, have a wonderful summer Keep up the good work with exercise and healthy diet  Let's plan to visit in one year assuming all is well

## 2022-08-03 ENCOUNTER — Encounter: Payer: Self-pay | Admitting: Family Medicine

## 2022-08-03 ENCOUNTER — Ambulatory Visit (INDEPENDENT_AMBULATORY_CARE_PROVIDER_SITE_OTHER): Payer: Managed Care, Other (non HMO) | Admitting: Family Medicine

## 2022-08-03 VITALS — BP 130/72 | HR 60 | Temp 97.8°F | Resp 18 | Ht 70.0 in | Wt 177.4 lb

## 2022-08-03 DIAGNOSIS — Z130 Encounter for screening for diseases of the blood and blood-forming organs and certain disorders involving the immune mechanism: Secondary | ICD-10-CM

## 2022-08-03 DIAGNOSIS — Z131 Encounter for screening for diabetes mellitus: Secondary | ICD-10-CM

## 2022-08-03 DIAGNOSIS — Z0000 Encounter for general adult medical examination without abnormal findings: Secondary | ICD-10-CM

## 2022-08-03 DIAGNOSIS — Z125 Encounter for screening for malignant neoplasm of prostate: Secondary | ICD-10-CM | POA: Diagnosis not present

## 2022-08-03 DIAGNOSIS — Z13220 Encounter for screening for lipoid disorders: Secondary | ICD-10-CM

## 2022-08-03 LAB — LIPID PANEL
Cholesterol: 141 mg/dL (ref 0–200)
HDL: 53.8 mg/dL (ref >39.00)
LDL Cholesterol: 67 mg/dL (ref 0–99)
NonHDL: 87.03
Total CHOL/HDL Ratio: 3
Triglycerides: 98 mg/dL (ref 0.0–149.0)
VLDL: 19.6 mg/dL (ref 0.0–40.0)

## 2022-08-03 LAB — CBC
HCT: 45.6 % (ref 39.0–52.0)
Hemoglobin: 14.8 g/dL (ref 13.0–17.0)
MCHC: 32.4 g/dL (ref 30.0–36.0)
MCV: 81.3 fl (ref 78.0–100.0)
Platelets: 324 10*3/uL (ref 150.0–400.0)
RBC: 5.6 Mil/uL (ref 4.22–5.81)
RDW: 13.5 % (ref 11.5–15.5)
WBC: 5 10*3/uL (ref 4.0–10.5)

## 2022-08-03 LAB — COMPREHENSIVE METABOLIC PANEL
ALT: 28 U/L (ref 0–53)
AST: 32 U/L (ref 0–37)
Albumin: 4.5 g/dL (ref 3.5–5.2)
Alkaline Phosphatase: 33 U/L — ABNORMAL LOW (ref 39–117)
BUN: 11 mg/dL (ref 6–23)
CO2: 29 mEq/L (ref 19–32)
Calcium: 9.8 mg/dL (ref 8.4–10.5)
Chloride: 102 mEq/L (ref 96–112)
Creatinine, Ser: 1.14 mg/dL (ref 0.40–1.50)
GFR: 79.52 mL/min (ref >60.00)
Glucose, Bld: 88 mg/dL (ref 70–99)
Potassium: 4.2 mEq/L (ref 3.5–5.1)
Sodium: 138 mEq/L (ref 135–145)
Total Bilirubin: 0.5 mg/dL (ref 0.2–1.2)
Total Protein: 7.4 g/dL (ref 6.0–8.3)

## 2022-08-03 LAB — PSA: PSA: 2.36 ng/mL (ref 0.10–4.00)

## 2022-08-03 LAB — HEMOGLOBIN A1C: Hgb A1c MFr Bld: 5.4 % (ref 4.6–6.5)

## 2022-12-19 NOTE — Patient Instructions (Incomplete)
  It was good to see you again today  Recommend seasonal flu shot, COVID booster if not done already  We will get you in with sports medicine to look at your achilles In the meantime take it a bit easy as far as your exercise routine

## 2022-12-19 NOTE — Progress Notes (Unsigned)
[facility] at [facility] [address] [place], [place] [address] [phone]  Date:  12/21/2022   Name:  Daniel Mcclure.   DOB:  [birth year]   MRN:  [id number]  PCP:  Pearline Cables, MD    Chief Complaint: No chief complaint on file.   History of Present Illness:  Daniel Rominger. is a 42 y.o. very pleasant male patient who presents with the following:  Patient seen today with concern of possible Achilles tendon problem Most recent visit with myself was in June for his physical  Generally in good health, he does get regular exercise  Flu shot COVID booster  Patient Active Problem List   Diagnosis Date Noted   Enlarged prostate 09/07/2017   Orthostatic hypotension 09/06/2017   SIRS (systemic inflammatory response syndrome) (HCC) 09/04/2017   Acute lower UTI 09/04/2017    Past Medical History:  Diagnosis Date   Hallux valgus due to metatarsus primus varus 12/2014   right   Hx of migraine headaches     Past Surgical History:  Procedure Laterality Date   BUNIONECTOMY Left    KNEE SURGERY Left 1992   METATARSAL OSTEOTOMY WITH BUNIONECTOMY Right 01/01/2015   Procedure: FIRST METATARSAL SCARF OSTEOTOMY, MODIFIED MCBRIDE BUNIONECTOMY;  Surgeon: Toni Arthurs, MD;  Location: [facility];  Service: Orthopedics;  Laterality: Right;    Social History   Tobacco Use   Smoking status: Never   Smokeless tobacco: Never  Vaping Use   Vaping status: Never Used  Substance Use Topics   Alcohol use: No   Drug use: No    Family History  Problem Relation Age of Onset   Hypertension Father    Stroke Paternal Grandfather    Cancer Neg Hx     No Known Allergies  Medication list has been reviewed and updated.  No current outpatient medications on file prior to visit.   No current facility-administered medications on file prior to visit.    Review of Systems:  As per HPI- otherwise  negative.   Physical Examination: There were no vitals filed for this visit. There were no vitals filed for this visit. There is no height or weight on file to calculate BMI. Ideal Body Weight:    GEN: no acute distress. HEENT: Atraumatic, Normocephalic.  Ears and Nose: No external deformity. CV: RRR, No M/G/R. No JVD. No thrill. No extra heart sounds. PULM: CTA B, no wheezes, crackles, rhonchi. No retractions. No resp. distress. No accessory muscle use. ABD: S, NT, ND, +BS. No rebound. No HSM. EXTR: No c/c/e PSYCH: Normally interactive. Conversant.    Assessment and Plan: ***  Signed Abbe Amsterdam, MD

## 2022-12-21 ENCOUNTER — Encounter: Payer: Self-pay | Admitting: Family Medicine

## 2022-12-21 ENCOUNTER — Ambulatory Visit: Payer: Managed Care, Other (non HMO) | Admitting: Family Medicine

## 2022-12-21 VITALS — BP 128/72 | HR 60 | Resp 18 | Ht 70.0 in | Wt 176.6 lb

## 2022-12-21 DIAGNOSIS — M7662 Achilles tendinitis, left leg: Secondary | ICD-10-CM | POA: Diagnosis not present

## 2022-12-28 ENCOUNTER — Encounter: Payer: Self-pay | Admitting: Sports Medicine

## 2022-12-28 ENCOUNTER — Ambulatory Visit: Payer: Managed Care, Other (non HMO) | Admitting: Sports Medicine

## 2022-12-28 VITALS — BP 120/68 | Ht 70.0 in | Wt 176.0 lb

## 2022-12-28 DIAGNOSIS — M7662 Achilles tendinitis, left leg: Secondary | ICD-10-CM | POA: Diagnosis not present

## 2022-12-28 NOTE — Progress Notes (Signed)
   Subjective:    Patient ID: Daniel Mcclure., male    DOB: February 27, 1980, 42 y.o.   MRN: [id number]  HPI chief complaint: Left Achilles pain  Patient is a very pleasant and very active 42 year old male that presents today with approximately 2 months of left Achilles pain.  His pain is only noticeable when first getting up in the morning or when getting up from a seated position.  He describes pain and stiffness in the Achilles tendon that lasts for about 30 seconds to a minute before resolving.  He is then able to continue with his activities including exercise with no real pain.  He denies any change in his exercise routine.  No known trauma.  No similar problems in the past.  Past medical history reviewed Medications reviewed Allergies reviewed  Review of Systems As above    Objective:   Physical Exam  Well-developed, well-nourished.  No acute distress  Left Achilles: No tenderness to palpation.  No swelling.  No palpable defect.  Good strength.  Limited MSK ultrasound of the left Achilles shows no significant thickening or hypoechoic changes to suggest tendinopathy or partial tearing      Assessment & Plan:   Left Achilles tendinitis  3/16 inch heel lifts to be worn in his shoes until symptoms resolved.  He will start a home exercise program consisting of daily calf stretches and eccentric exercises.  He may continue with activity as tolerated but I did caution him about any sort of exercise that involves sprinting or jumping off of his left foot.  If symptoms do not improve with home exercises then consider formal physical therapy.  Follow-up for ongoing or recalcitrant issues.  This note was dictated using Dragon naturally speaking software and may contain errors in syntax, spelling, or content which have not been identified prior to signing this note.

## 2023-04-25 ENCOUNTER — Encounter: Payer: Self-pay | Admitting: Family Medicine

## 2023-08-07 ENCOUNTER — Encounter: Payer: Managed Care, Other (non HMO) | Admitting: Family Medicine

## 2023-08-12 NOTE — Patient Instructions (Signed)
 Good to see you again today- I will be in touch with your labs asap  Keep up the good work!

## 2023-08-12 NOTE — Progress Notes (Unsigned)
 [facility] at [facility] [address] [place], [place] [address] [phone]  Date:  08/16/2023   Name:  Daniel Mcclure.   DOB:  [birth year]   MRN:  [id number]  PCP:  Watt Harlene BROCKS, MD    Chief Complaint: No chief complaint on file.   History of Present Illness:  Daniel Mcclure. is a 43 y.o. very pleasant male patient who presents with the following:  Pt seen today for a CPE Last seen by myself in October when he was having some achilles pain  Generally in good health  Most recent labs one year ago   Married to [place], daughtrs 14 and 7 Works in Photographer in an IT role Enjoys plenty of exercise  At our last visit his best friend was very ill with pancreatic cancer   Family history of colon cancer: Family history other cancer:   Does not use tobacco   Patient Active Problem List   Diagnosis Date Noted   Enlarged prostate 09/07/2017   Orthostatic hypotension 09/06/2017   SIRS (systemic inflammatory response syndrome) (HCC) 09/04/2017   Acute lower UTI 09/04/2017    Past Medical History:  Diagnosis Date   Hallux valgus due to metatarsus primus varus 12/2014   right   Hx of migraine headaches     Past Surgical History:  Procedure Laterality Date   BUNIONECTOMY Left    KNEE SURGERY Left 1992   METATARSAL OSTEOTOMY WITH BUNIONECTOMY Right 01/01/2015   Procedure: FIRST METATARSAL SCARF OSTEOTOMY, MODIFIED MCBRIDE BUNIONECTOMY;  Surgeon: Norleen Armor, MD;  Location: [facility];  Service: Orthopedics;  Laterality: Right;    Social History   Tobacco Use   Smoking status: Never   Smokeless tobacco: Never  Vaping Use   Vaping status: Never Used  Substance Use Topics   Alcohol use: No   Drug use: No    Family History  Problem Relation Age of Onset   Hypertension Father    Stroke Paternal Grandfather    Cancer Neg Hx     No Known Allergies  Medication list has been reviewed and  updated.  No current outpatient medications on file prior to visit.   No current facility-administered medications on file prior to visit.    Review of Systems:  As per HPI- otherwise negative.   Physical Examination: There were no vitals filed for this visit. There were no vitals filed for this visit. There is no height or weight on file to calculate BMI. Ideal Body Weight:    GEN: no acute distress. HEENT: Atraumatic, Normocephalic.  Ears and Nose: No external deformity. CV: RRR, No M/G/R. No JVD. No thrill. No extra heart sounds. PULM: CTA B, no wheezes, crackles, rhonchi. No retractions. No resp. distress. No accessory muscle use. ABD: S, NT, ND, +BS. No rebound. No HSM. EXTR: No c/c/e PSYCH: Normally interactive. Conversant.    Assessment and Plan: *** Physical exam today- recommended healthy diet and exercise routine Will plan further follow- up pending labs.  Signed Harlene Watt, MD

## 2023-08-14 ENCOUNTER — Ambulatory Visit: Admitting: Family Medicine

## 2023-08-16 ENCOUNTER — Ambulatory Visit (INDEPENDENT_AMBULATORY_CARE_PROVIDER_SITE_OTHER): Admitting: Family Medicine

## 2023-08-16 ENCOUNTER — Encounter: Payer: Self-pay | Admitting: Family Medicine

## 2023-08-16 VITALS — BP 130/62 | HR 90 | Ht 70.0 in | Wt 174.8 lb

## 2023-08-16 DIAGNOSIS — Z0000 Encounter for general adult medical examination without abnormal findings: Secondary | ICD-10-CM

## 2023-08-16 DIAGNOSIS — Z131 Encounter for screening for diabetes mellitus: Secondary | ICD-10-CM

## 2023-08-16 DIAGNOSIS — Z130 Encounter for screening for diseases of the blood and blood-forming organs and certain disorders involving the immune mechanism: Secondary | ICD-10-CM

## 2023-08-16 DIAGNOSIS — Z13220 Encounter for screening for lipoid disorders: Secondary | ICD-10-CM | POA: Diagnosis not present

## 2023-08-16 DIAGNOSIS — Z125 Encounter for screening for malignant neoplasm of prostate: Secondary | ICD-10-CM | POA: Diagnosis not present

## 2023-08-17 ENCOUNTER — Encounter: Payer: Self-pay | Admitting: Family Medicine

## 2023-08-17 LAB — CBC
HCT: 42.7 % (ref 39.0–52.0)
Hemoglobin: 14.3 g/dL (ref 13.0–17.0)
MCHC: 33.5 g/dL (ref 30.0–36.0)
MCV: 79.8 fl (ref 78.0–100.0)
Platelets: 328 10*3/uL (ref 150.0–400.0)
RBC: 5.35 Mil/uL (ref 4.22–5.81)
RDW: 13.7 % (ref 11.5–15.5)
WBC: 7.3 10*3/uL (ref 4.0–10.5)

## 2023-08-17 LAB — HEMOGLOBIN A1C: Hgb A1c MFr Bld: 5.5 % (ref 4.6–6.5)

## 2023-08-17 LAB — LIPID PANEL
Cholesterol: 151 mg/dL (ref 0–200)
HDL: 55.1 mg/dL (ref >39.00)
LDL Cholesterol: 67 mg/dL (ref 0–99)
NonHDL: 95.41
Total CHOL/HDL Ratio: 3
Triglycerides: 144 mg/dL (ref 0.0–149.0)
VLDL: 28.8 mg/dL (ref 0.0–40.0)

## 2023-08-17 LAB — COMPREHENSIVE METABOLIC PANEL WITH GFR
ALT: 22 U/L (ref 0–53)
AST: 22 U/L (ref 0–37)
Albumin: 4.6 g/dL (ref 3.5–5.2)
Alkaline Phosphatase: 33 U/L — ABNORMAL LOW (ref 39–117)
BUN: 12 mg/dL (ref 6–23)
CO2: 29 meq/L (ref 19–32)
Calcium: 9.8 mg/dL (ref 8.4–10.5)
Chloride: 101 meq/L (ref 96–112)
Creatinine, Ser: 1.18 mg/dL (ref 0.40–1.50)
GFR: 75.74 mL/min (ref >60.00)
Glucose, Bld: 93 mg/dL (ref 70–99)
Potassium: 4 meq/L (ref 3.5–5.1)
Sodium: 139 meq/L (ref 135–145)
Total Bilirubin: 0.5 mg/dL (ref 0.2–1.2)
Total Protein: 7.1 g/dL (ref 6.0–8.3)

## 2023-08-17 LAB — PSA: PSA: 1.63 ng/mL (ref 0.10–4.00)

## 2023-09-24 NOTE — Progress Notes (Unsigned)
 [facility] at [facility] [address], [address] [place], [place] [address] [phone]  Date:  09/27/2023   Name:  Daniel Mcclure.   DOB:  04-11-1980   MRN:  [id number]  PCP:  Watt Harlene BROCKS, MD    Chief Complaint: Skin Problem   History of Present Illness:  Daniel Mizer. is a 43 y.o. very pleasant male patient who presents with the following:  Patient seen today with concern of skin irritation.  Most recent visit with my was at the end of June for physical  Married to [place], daughters 10 and 7.  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 About a month ago he was doing some work out in the yard.  He did not feel anything sting or bite him, but later that day he noted a discrete patch of very itchy, irritated skin on his left flank.  A day or so later he developed another similar patch on his right lateral abdomen.  They were not painful or tingling, just itchy.  Both are now resolved and he is feeling back to normal.  No other skin concerns at this time  Patient Active Problem List   Diagnosis Date Noted   Enlarged prostate 09/07/2017   Orthostatic hypotension 09/06/2017   SIRS (systemic inflammatory response syndrome) (HCC) 09/04/2017   Acute lower UTI 09/04/2017    Past Medical History:  Diagnosis Date   Hallux valgus due to metatarsus primus varus 12/2014   right   Hx of migraine headaches     Past Surgical History:  Procedure Laterality Date   BUNIONECTOMY Left    KNEE SURGERY Left 1992   METATARSAL OSTEOTOMY WITH BUNIONECTOMY Right 01/01/2015   Procedure: FIRST METATARSAL SCARF OSTEOTOMY, MODIFIED MCBRIDE BUNIONECTOMY;  Surgeon: Norleen Armor, MD;  Location: [facility];  Service:  Orthopedics;  Laterality: Right;    Social History   Tobacco Use   Smoking status: Never   Smokeless tobacco: Never  Vaping Use   Vaping status: Never Used  Substance Use Topics   Alcohol use: No   Drug use: No    Family History  Problem Relation Age of Onset   Hypertension Father    Stroke Paternal Grandfather    Cancer Neg Hx     No Known Allergies  Medication list has been reviewed and updated.  No current outpatient medications on file prior to visit.   No current facility-administered medications on file prior to visit.    Review of Systems:  As per HPI- otherwise negative.   Physical Examination: Vitals:   09/27/23 1543  BP: 120/80  Pulse: 60  SpO2: 97%   Vitals:   09/27/23 1543  Weight: 173 lb (78.5 kg)  Height: 5' 10 (1.778 m)   Body mass index is 24.82 kg/m. Ideal Body Weight: Weight in (lb) to have BMI = 25: 173.9  GEN: no acute distress.  Normal weight, looks well HEENT: Atraumatic, Normocephalic.  No oral lesions Ears and Nose: No external deformity. CV: RRR, No M/G/R. No JVD. No thrill. No extra heart sounds. PULM: CTA B, no wheezes, crackles, rhonchi. No retractions. No resp. distress. No accessory muscle use. EXTR: No c/c/e PSYCH: Normally  interactive. Conversant.   Skin finding is basically resolved at this time, there is a bit of postinflammatory hyperpigmentation on his left flank.  He shows me photos of the rash on his phone when it was more significant.  Most likely a contact dermatitis of some sort.  Could consider shingles but as it was present on both sides of his body this is highly unlikely Assessment and Plan: Allergic contact dermatitis, unspecified trigger  Patient seen today with concern of recent contact dermatitis on his skin.  This occurred after he was working out in his yard.  Suspect he came in contact with a plant that caused allergic reaction.  Reassured him I do not think this is shingles or anything more serious.   He is no longer having any symptoms and feels well today.  He will keep us  posted  Signed Harlene Schroeder, MD

## 2023-09-27 ENCOUNTER — Ambulatory Visit: Admitting: Family Medicine

## 2023-09-27 VITALS — BP 120/80 | HR 60 | Ht 70.0 in | Wt 173.0 lb

## 2023-09-27 DIAGNOSIS — L239 Allergic contact dermatitis, unspecified cause: Secondary | ICD-10-CM | POA: Diagnosis not present

## 2024-03-13 ENCOUNTER — Ambulatory Visit
Admission: EM | Admit: 2024-03-13 | Discharge: 2024-03-13 | Disposition: A | Discharge status: Home / Self Care | Attending: Student | Admitting: Student

## 2024-03-13 ENCOUNTER — Encounter: Payer: Self-pay | Admitting: Emergency Medicine

## 2024-03-13 DIAGNOSIS — J029 Acute pharyngitis, unspecified: Secondary | ICD-10-CM | POA: Diagnosis not present

## 2024-03-13 DIAGNOSIS — J069 Acute upper respiratory infection, unspecified: Secondary | ICD-10-CM | POA: Insufficient documentation

## 2024-03-13 LAB — POCT RAPID STREP A (OFFICE): Rapid Strep A Screen: NEGATIVE

## 2024-03-13 LAB — POC SOFIA SARS ANTIGEN FIA: SARS Coronavirus 2 Ag: NEGATIVE

## 2024-03-13 LAB — POCT INFLUENZA A/B
Influenza A, POC: NEGATIVE
Influenza B, POC: NEGATIVE

## 2024-03-13 MED ORDER — PROMETHAZINE-DM 6.25-15 MG/5ML PO SYRP: 5.0000 mL | ORAL_SOLUTION | Freq: Four times a day (QID) | ORAL | 0 refills | Status: AC | PRN

## 2024-03-13 MED ORDER — PREDNISONE 20 MG PO TABS: 20.0000 mg | ORAL_TABLET | Freq: Every day | ORAL | 0 refills | Status: AC

## 2024-03-13 NOTE — ED Provider Notes (Signed)
 [facility]    CSN: [id number] Arrival date & time: 03/13/24  0803      History   Chief Complaint Chief Complaint  Patient presents with   Sore Throat    HPI Daniel Mcclure. is a 44 y.o. male presenting with sore throat.  -Endorses: Sore throat, hoarse voice, headaches. Minimal nonproductive cough. -Highest fever: no fevers  -Denies: myalgias, n/v/d/c/abd pain -Duration of symptoms: 1 day -Sick contacts: yes (viral illness) -Interventions attempted:  none.  -The patient denies a history of pulmonary disease. The patient has not required an inhaler in the past.  HPI  Past Medical History:  Diagnosis Date   Hallux valgus due to metatarsus primus varus 12/2014   right   Hx of migraine headaches     Patient Active Problem List   Diagnosis Date Noted   Enlarged prostate 09/07/2017   Orthostatic hypotension 09/06/2017   SIRS (systemic inflammatory response syndrome) (HCC) 09/04/2017   Acute lower UTI 09/04/2017    Past Surgical History:  Procedure Laterality Date   BUNIONECTOMY Left    KNEE SURGERY Left 1992   METATARSAL OSTEOTOMY WITH BUNIONECTOMY Right 01/01/2015   Procedure: FIRST METATARSAL SCARF OSTEOTOMY, MODIFIED MCBRIDE BUNIONECTOMY;  Surgeon: Norleen Armor, MD;  Location: [facility];  Service: Orthopedics;  Laterality: Right;       Home Medications    Prior to Admission medications  Medication Sig Start Date End Date Taking? Authorizing Provider  predniSONE  (DELTASONE ) 20 MG tablet Take 1 tablet (20 mg total) by mouth daily for 5 days. 03/13/24 03/18/24 Yes Terresa Marlett E, PA-C  promethazine -dextromethorphan (PROMETHAZINE -DM) 6.25-15 MG/5ML syrup Take 5 mLs by mouth 4 (four) times daily as needed for cough. 03/13/24  Yes Arlyss Leita BRAVO, PA-C    Family History Family History  Problem Relation Age of Onset   Hypertension Father    Stroke Paternal Grandfather    Cancer Neg Hx     Social History Social  History[1]   Allergies   Patient has no known allergies.   Review of Systems Review of Systems  Constitutional:  Negative for appetite change, chills and fever.  HENT:  Positive for congestion, sore throat and voice change. Negative for ear pain, rhinorrhea, sinus pressure and sinus pain.   Eyes:  Negative for redness and visual disturbance.  Respiratory:  Positive for cough. Negative for chest tightness, shortness of breath and wheezing.   Cardiovascular:  Negative for chest pain and palpitations.  Gastrointestinal:  Negative for abdominal pain, constipation, diarrhea, nausea and vomiting.  Genitourinary:  Negative for dysuria, frequency and urgency.  Musculoskeletal:  Negative for myalgias.  Neurological:  Negative for dizziness, weakness and headaches.  Psychiatric/Behavioral:  Negative for confusion.   All other systems reviewed and are negative.    Physical Exam Triage Vital Signs ED Triage Vitals  Encounter Vitals Group     BP 03/13/24 0820 (!) 147/66     Girls Systolic BP Percentile --      Girls Diastolic BP Percentile --      Boys Systolic BP Percentile --      Boys Diastolic BP Percentile --      Pulse Rate 03/13/24 0820 64     Resp 03/13/24 0820 17     Temp 03/13/24 0820 98 F (36.7 C)     Temp Source 03/13/24 0820 Oral     SpO2 03/13/24 0820 98 %     Weight --      Height --  Head Circumference --      Peak Flow --      Pain Score 03/13/24 0821 5     Pain Loc --      Pain Education --      Exclude from Growth Chart --    No data found.  Updated Vital Signs BP (!) 147/66 (BP Location: Right Arm)   Pulse 64   Temp 98 F (36.7 C) (Oral)   Resp 17   SpO2 98%   Visual Acuity Right Eye Distance:   Left Eye Distance:   Bilateral Distance:    Right Eye Near:   Left Eye Near:    Bilateral Near:     Physical Exam Vitals reviewed.  Constitutional:      General: He is not in acute distress.    Appearance: Normal appearance. He is not  ill-appearing.  HENT:     Head: Normocephalic and atraumatic.     Right Ear: Tympanic membrane, ear canal and external ear normal. No tenderness. No middle ear effusion. There is no impacted cerumen. Tympanic membrane is not perforated, erythematous, retracted or bulging.     Left Ear: Tympanic membrane, ear canal and external ear normal. No tenderness.  No middle ear effusion. There is no impacted cerumen. Tympanic membrane is not perforated, erythematous, retracted or bulging.     Nose: Nose normal. No congestion.     Mouth/Throat:     Mouth: Mucous membranes are moist.     Pharynx: Uvula midline. No oropharyngeal exudate or posterior oropharyngeal erythema.     Tonsils: No tonsillar exudate.     Comments: Minimal posterior oropharyngeal erythema.  Tonsils are surgically absent. Eyes:     Extraocular Movements: Extraocular movements intact.     Pupils: Pupils are equal, round, and reactive to light.  Cardiovascular:     Rate and Rhythm: Normal rate and regular rhythm.     Heart sounds: Normal heart sounds.  Pulmonary:     Effort: Pulmonary effort is normal.     Breath sounds: Normal breath sounds. No decreased breath sounds, wheezing, rhonchi or rales.  Abdominal:     Palpations: Abdomen is soft.     Tenderness: There is no abdominal tenderness. There is no guarding or rebound.  Lymphadenopathy:     Cervical: No cervical adenopathy.     Right cervical: No superficial, deep or posterior cervical adenopathy.    Left cervical: No superficial, deep or posterior cervical adenopathy.  Skin:    Comments: No rash   Neurological:     General: No focal deficit present.     Mental Status: He is alert and oriented to person, place, and time.  Psychiatric:        Mood and Affect: Mood normal.        Behavior: Behavior normal.        Thought Content: Thought content normal.        Judgment: Judgment normal.      UC Treatments / Results  Labs (all labs ordered are listed, but only  abnormal results are displayed) Labs Reviewed  CULTURE, GROUP A STREP [facility])  POCT RAPID STREP A (OFFICE)  POCT INFLUENZA A/B  POC SOFIA SARS ANTIGEN FIA    EKG   Radiology No results found.  Procedures Procedures (including critical care time)  Medications Ordered in UC Medications - No data to display  Initial Impression / Assessment and Plan / UC Course  I have reviewed the triage vital signs and the nursing notes.  Pertinent labs & imaging results that were available during my care of the patient were reviewed by me and considered in my medical decision making (see chart for details).     Patient is a pleasant 44 y.o. male presenting with viral URI. The patient is afebrile and nontachycardic.  Antipyretic has not been administered today.  -Covid negative -Influenza negative -Rapid strep negative, culture sent.   Will manage with prednisone  for hoarse voice, and Promethazine  DM sent to have on hand.  Also consider adding Mucinex or DayQuil during the day.  Return precautions as below.  Final Clinical Impressions(s) / UC Diagnoses   Final diagnoses:  Sore throat  Viral URI     Discharge Instructions      -Your strep, COVID and influenza tests were negative. -You have a virus, like the common cold.  Viruses typically last 5 to 7 days.  After 7 days, your symptoms should be improving rather than worsening.  If your symptoms improve, and then worsen again, this is when we worry about a sinus infection or a lung infection, and you should return for additional care. -For hoarse voice: Prednisone , 2 pills taken at the same time for 5 days in a row.  Try taking this earlier in the day as it can give you energy. Avoid NSAIDs like ibuprofen  and alleve while taking this medication as they can increase your risk of stomach upset and even GI bleeding when in combination with a steroid. You can continue tylenol  (acetaminophen ) up to 1000mg  3x daily. -Promethazine  DM cough syrup  for congestion/cough. This could make you drowsy, so take at night before bed. -Consider taking Mucinex or Dayquil during the day for non-drowsy relief of congestion and cough! -If you develop fevers/bodyaches: -You can take Tylenol  up to 1000 mg 3 times daily, and ibuprofen  up to 600 mg 3 times daily with food.  You can take these together, or alternate every 3-4 hours.     ED Prescriptions     Medication Sig Dispense Auth. Provider   promethazine -dextromethorphan (PROMETHAZINE -DM) 6.25-15 MG/5ML syrup Take 5 mLs by mouth 4 (four) times daily as needed for cough. 118 mL Kaylei Frink E, PA-C   predniSONE  (DELTASONE ) 20 MG tablet Take 1 tablet (20 mg total) by mouth daily for 5 days. 5 tablet Jermany Sundell E, PA-C      PDMP not reviewed this encounter.     [1]  Social History Tobacco Use   Smoking status: Never   Smokeless tobacco: Never  Vaping Use   Vaping status: Never Used  Substance Use Topics   Alcohol use: No   Drug use: No     Arlyss Leita BRAVO, PA-C 03/13/24 [id number]  "

## 2024-03-13 NOTE — Discharge Instructions (Addendum)
-  Your strep, COVID and influenza tests were negative. -You have a virus, like the common cold.  Viruses typically last 5 to 7 days.  After 7 days, your symptoms should be improving rather than worsening.  If your symptoms improve, and then worsen again, this is when we worry about a sinus infection or a lung infection, and you should return for additional care. -For hoarse voice: Prednisone , 2 pills taken at the same time for 5 days in a row.  Try taking this earlier in the day as it can give you energy. Avoid NSAIDs like ibuprofen  and alleve while taking this medication as they can increase your risk of stomach upset and even GI bleeding when in combination with a steroid. You can continue tylenol  (acetaminophen ) up to 1000mg  3x daily. -Promethazine  DM cough syrup for congestion/cough. This could make you drowsy, so take at night before bed. -Consider taking Mucinex or Dayquil during the day for non-drowsy relief of congestion and cough! -If you develop fevers/bodyaches: -You can take Tylenol  up to 1000 mg 3 times daily, and ibuprofen  up to 600 mg 3 times daily with food.  You can take these together, or alternate every 3-4 hours.

## 2024-03-13 NOTE — ED Triage Notes (Signed)
 Pt c/o sore throat, loss of voice, and headache for 1 day.

## 2024-03-17 LAB — CULTURE, GROUP A STREP (THRC)

## 2024-03-19 ENCOUNTER — Ambulatory Visit (HOSPITAL_COMMUNITY): Payer: Self-pay

## 2024-08-21 ENCOUNTER — Encounter: Admitting: Family Medicine
